# Patient Record
Sex: Female | Born: 1945 | Race: White | Hispanic: No | Smoking: Former smoker
Health system: Southern US, Community
[De-identification: ages and names within clinical notes are randomized; demographics above are authoritative.]

## PROBLEM LIST (undated history)

## (undated) DIAGNOSIS — I480 Paroxysmal atrial fibrillation: Secondary | ICD-10-CM

## (undated) DIAGNOSIS — M1990 Unspecified osteoarthritis, unspecified site: Secondary | ICD-10-CM

## (undated) DIAGNOSIS — Z974 Presence of external hearing-aid: Secondary | ICD-10-CM

## (undated) DIAGNOSIS — I70213 Atherosclerosis of native arteries of extremities with intermittent claudication, bilateral legs: Secondary | ICD-10-CM

## (undated) DIAGNOSIS — I10 Essential (primary) hypertension: Secondary | ICD-10-CM

## (undated) DIAGNOSIS — K635 Polyp of colon: Secondary | ICD-10-CM

## (undated) DIAGNOSIS — J939 Pneumothorax, unspecified: Secondary | ICD-10-CM

## (undated) DIAGNOSIS — K279 Peptic ulcer, site unspecified, unspecified as acute or chronic, without hemorrhage or perforation: Secondary | ICD-10-CM

## (undated) DIAGNOSIS — Z972 Presence of dental prosthetic device (complete) (partial): Secondary | ICD-10-CM

## (undated) DIAGNOSIS — E785 Hyperlipidemia, unspecified: Secondary | ICD-10-CM

## (undated) DIAGNOSIS — E119 Type 2 diabetes mellitus without complications: Secondary | ICD-10-CM

## (undated) DIAGNOSIS — D751 Secondary polycythemia: Secondary | ICD-10-CM

## (undated) DIAGNOSIS — K5520 Angiodysplasia of colon without hemorrhage: Secondary | ICD-10-CM

## (undated) DIAGNOSIS — C189 Malignant neoplasm of colon, unspecified: Secondary | ICD-10-CM

## (undated) DIAGNOSIS — I4891 Unspecified atrial fibrillation: Secondary | ICD-10-CM

## (undated) DIAGNOSIS — C801 Malignant (primary) neoplasm, unspecified: Secondary | ICD-10-CM

## (undated) DIAGNOSIS — D509 Iron deficiency anemia, unspecified: Secondary | ICD-10-CM

## (undated) DIAGNOSIS — D126 Benign neoplasm of colon, unspecified: Secondary | ICD-10-CM

## (undated) DIAGNOSIS — Z8673 Personal history of transient ischemic attack (TIA), and cerebral infarction without residual deficits: Secondary | ICD-10-CM

## (undated) DIAGNOSIS — Z85038 Personal history of other malignant neoplasm of large intestine: Secondary | ICD-10-CM

## (undated) DIAGNOSIS — M7582 Other shoulder lesions, left shoulder: Secondary | ICD-10-CM

## (undated) HISTORY — PX: CHOLECYSTECTOMY: SHX55

## (undated) HISTORY — PX: COLON SURGERY: SHX602

## (undated) HISTORY — PX: EXPLORATORY LAPAROTOMY: SUR591

## (undated) HISTORY — PX: DIAGNOSTIC LAPAROSCOPY: SUR761

---

## 2005-11-01 ENCOUNTER — Ambulatory Visit: Payer: Self-pay | Admitting: Internal Medicine

## 2006-03-23 ENCOUNTER — Ambulatory Visit: Payer: Self-pay | Admitting: Gastroenterology

## 2006-05-21 ENCOUNTER — Inpatient Hospital Stay: Payer: Self-pay | Admitting: Surgery

## 2006-06-02 ENCOUNTER — Inpatient Hospital Stay: Payer: Self-pay | Admitting: Surgery

## 2006-06-12 ENCOUNTER — Ambulatory Visit: Payer: Self-pay | Admitting: Internal Medicine

## 2006-06-28 ENCOUNTER — Ambulatory Visit: Payer: Self-pay | Admitting: Internal Medicine

## 2006-07-23 ENCOUNTER — Ambulatory Visit: Payer: Self-pay | Admitting: Internal Medicine

## 2006-12-13 ENCOUNTER — Ambulatory Visit: Payer: Self-pay | Admitting: Internal Medicine

## 2006-12-17 ENCOUNTER — Ambulatory Visit: Payer: Self-pay | Admitting: Internal Medicine

## 2007-05-31 ENCOUNTER — Ambulatory Visit: Payer: Self-pay | Admitting: Gastroenterology

## 2008-02-10 ENCOUNTER — Ambulatory Visit: Payer: Self-pay | Admitting: Family Medicine

## 2009-10-06 ENCOUNTER — Ambulatory Visit: Payer: Self-pay | Admitting: Internal Medicine

## 2010-07-22 ENCOUNTER — Ambulatory Visit: Payer: Self-pay | Admitting: Gastroenterology

## 2010-07-25 LAB — PATHOLOGY REPORT

## 2010-10-28 ENCOUNTER — Ambulatory Visit: Payer: Self-pay | Admitting: Gastroenterology

## 2010-10-31 LAB — PATHOLOGY REPORT

## 2011-03-22 ENCOUNTER — Ambulatory Visit: Payer: Self-pay | Admitting: Internal Medicine

## 2011-09-21 ENCOUNTER — Emergency Department: Payer: Self-pay | Admitting: *Deleted

## 2015-05-20 DIAGNOSIS — Z85038 Personal history of other malignant neoplasm of large intestine: Secondary | ICD-10-CM | POA: Insufficient documentation

## 2015-07-09 ENCOUNTER — Ambulatory Visit: Payer: Medicare Other | Admitting: Anesthesiology

## 2015-07-09 ENCOUNTER — Encounter: Payer: Self-pay | Admitting: Anesthesiology

## 2015-07-09 ENCOUNTER — Ambulatory Visit
Admission: RE | Admit: 2015-07-09 | Discharge: 2015-07-09 | Disposition: A | Discharge status: Home / Self Care | Payer: Medicare Other | Source: Ambulatory Visit | Attending: Gastroenterology | Admitting: Gastroenterology

## 2015-07-09 ENCOUNTER — Encounter
Admission: RE | Disposition: A | Discharge status: Home / Self Care | Payer: Self-pay | Source: Ambulatory Visit | Attending: Gastroenterology

## 2015-07-09 DIAGNOSIS — Z79899 Other long term (current) drug therapy: Secondary | ICD-10-CM | POA: Diagnosis not present

## 2015-07-09 DIAGNOSIS — Z08 Encounter for follow-up examination after completed treatment for malignant neoplasm: Secondary | ICD-10-CM | POA: Insufficient documentation

## 2015-07-09 DIAGNOSIS — Z85038 Personal history of other malignant neoplasm of large intestine: Secondary | ICD-10-CM | POA: Diagnosis present

## 2015-07-09 DIAGNOSIS — M1990 Unspecified osteoarthritis, unspecified site: Secondary | ICD-10-CM | POA: Insufficient documentation

## 2015-07-09 DIAGNOSIS — D125 Benign neoplasm of sigmoid colon: Secondary | ICD-10-CM | POA: Diagnosis not present

## 2015-07-09 DIAGNOSIS — K621 Rectal polyp: Secondary | ICD-10-CM | POA: Insufficient documentation

## 2015-07-09 DIAGNOSIS — Z8711 Personal history of peptic ulcer disease: Secondary | ICD-10-CM | POA: Diagnosis not present

## 2015-07-09 DIAGNOSIS — F17210 Nicotine dependence, cigarettes, uncomplicated: Secondary | ICD-10-CM | POA: Insufficient documentation

## 2015-07-09 HISTORY — DX: Pneumothorax, unspecified: J93.9

## 2015-07-09 HISTORY — PX: COLONOSCOPY WITH PROPOFOL: SHX5780

## 2015-07-09 HISTORY — DX: Unspecified osteoarthritis, unspecified site: M19.90

## 2015-07-09 HISTORY — DX: Peptic ulcer, site unspecified, unspecified as acute or chronic, without hemorrhage or perforation: K27.9

## 2015-07-09 HISTORY — DX: Malignant (primary) neoplasm, unspecified: C80.1

## 2015-07-09 SURGERY — COLONOSCOPY WITH PROPOFOL
Anesthesia: General

## 2015-07-09 MED ORDER — PROPOFOL INFUSION 10 MG/ML OPTIME
INTRAVENOUS | Status: DC | PRN
  Administered 2015-07-09: 125 ug/kg/min via INTRAVENOUS

## 2015-07-09 MED ORDER — LIDOCAINE HCL (CARDIAC) 20 MG/ML IV SOLN
INTRAVENOUS | Status: DC | PRN
  Administered 2015-07-09: 100 mg via INTRAVENOUS

## 2015-07-09 MED ORDER — PHENYLEPHRINE HCL 10 MG/ML IJ SOLN
INTRAMUSCULAR | Status: DC | PRN
  Administered 2015-07-09 (×2): 100 ug via INTRAVENOUS
  Administered 2015-07-09 (×5): 200 ug via INTRAVENOUS

## 2015-07-09 MED ORDER — SODIUM CHLORIDE 0.9 % IV SOLN
INTRAVENOUS | Status: DC
  Administered 2015-07-09: 1000 mL via INTRAVENOUS

## 2015-07-09 MED ORDER — SODIUM CHLORIDE 0.9 % IV SOLN: INTRAVENOUS | Status: DC

## 2015-07-09 MED ORDER — PROPOFOL 10 MG/ML IV BOLUS
INTRAVENOUS | Status: DC | PRN
  Administered 2015-07-09: 90 mg via INTRAVENOUS

## 2015-07-09 NOTE — Anesthesia Postprocedure Evaluation (Signed)
  Anesthesia Post-op Note  Patient: Dana Mcclure  Procedure(s) Performed: Procedure(s): COLONOSCOPY WITH PROPOFOL (N/A)  Anesthesia type:General  Patient location: PACU  Post pain: Pain level controlled  Post assessment: Post-op Vital signs reviewed, Patient's Cardiovascular Status Stable, Respiratory Function Stable, Patent Airway and No signs of Nausea or vomiting  Post vital signs: Reviewed and stable  Last Vitals:  Filed Vitals:   07/09/15 1140  BP: 146/71  Pulse: 66  Temp:   Resp: 16    Level of consciousness: awake, alert  and patient cooperative  Complications: No apparent anesthesia complications

## 2015-07-09 NOTE — H&P (Signed)
Outpatient short stay form Pre-procedure 07/09/2015 10:28 AM Lollie Sails MD  Primary Physician: Dr. Lisette Grinder  Reason for visit:  Colonoscopy  History of present illness:  Patient is a 69 year old female who is presenting today for a colonoscopy in regards to her personal history of colon cancer and polyps. Her last colonoscopy was in 2012 with an adenoma at that time. Previously she had had a right hemicolectomy in 2007. She does not take aspirin products or anticoagulation medications.        Current facility-administered medications:  .  0.9 %  sodium chloride infusion, , Intravenous, Continuous, Lollie Sails, MD, Last Rate: 20 mL/hr at 07/09/15 1016, 1,000 mL at 07/09/15 1016 .  0.9 %  sodium chloride infusion, , Intravenous, Continuous, Lollie Sails, MD  Prescriptions prior to admission  Medication Sig Dispense Refill Last Dose  . acetaminophen (TYLENOL) 325 MG tablet Take 650 mg by mouth every 4 (four) hours as needed.   Past Month at Unknown time     No Known Allergies   Past Medical History  Diagnosis Date  . Cancer   . Arthritis   . PUD (peptic ulcer disease)   . Pneumothorax on right     Review of systems:      Physical Exam    Heart and lungs: Regular rate and rhythm without rub or gallop lungs are bilaterally clear    HEENT: Norm cephalic atraumatic eyes are anicteric    Other:     Pertinant exam for procedure: Soft nontender nondistended bowel sounds positive normoactive    Planned proceedures: Colonoscopy and indicated procedures I have discussed the risks benefits and complications of procedures to include not limited to bleeding, infection, perforation and the risk of sedation and the patient wishes to proceed.    Lollie Sails, MD Gastroenterology 07/09/2015  10:28 AM

## 2015-07-09 NOTE — Transfer of Care (Signed)
Immediate Anesthesia Transfer of Care Note  Patient: Dana Mcclure  Procedure(s) Performed: Procedure(s): COLONOSCOPY WITH PROPOFOL (N/A)  Patient Location: Endoscopy Unit  Anesthesia Type:General  Level of Consciousness: awake  Airway & Oxygen Therapy: Patient Spontanous Breathing and Patient connected to nasal cannula oxygen  Post-op Assessment: Report given to RN and Post -op Vital signs reviewed and stable  Post vital signs: Reviewed and stable  Last Vitals:  Filed Vitals:   07/09/15 1006  BP: 160/72  Pulse: 72  Temp: 36.3 C  Resp: 18    Complications: No apparent anesthesia complications

## 2015-07-09 NOTE — Anesthesia Preprocedure Evaluation (Addendum)
Anesthesia Evaluation  Patient identified by MRN, date of birth, ID band Patient awake    Reviewed: Allergy & Precautions, H&P , NPO status , Patient's Chart, lab work & pertinent test results  Airway Mallampati: III  TM Distance: >3 FB Neck ROM: limited    Dental  (+) Caps, Poor Dentition, Chipped   Pulmonary neg pulmonary ROS, neg shortness of breath, Current Smoker,    Pulmonary exam normal breath sounds clear to auscultation       Cardiovascular Exercise Tolerance: Good (-) Past MI Normal cardiovascular exam Rhythm:regular Rate:Normal     Neuro/Psych negative neurological ROS  negative psych ROS   GI/Hepatic Neg liver ROS, PUD, neg GERD  ,  Endo/Other  negative endocrine ROS  Renal/GU negative Renal ROS  negative genitourinary   Musculoskeletal  (+) Arthritis ,   Abdominal   Peds  Hematology negative hematology ROS (+)   Anesthesia Other Findings Past Medical History:   Cancer                                                       Arthritis                                                    PUD (peptic ulcer disease)                                   Pneumothorax on right                                        Reproductive/Obstetrics negative OB ROS                            Anesthesia Physical Anesthesia Plan  ASA: III  Anesthesia Plan: General   Post-op Pain Management:    Induction:   Airway Management Planned:   Additional Equipment:   Intra-op Plan:   Post-operative Plan:   Informed Consent: I have reviewed the patients History and Physical, chart, labs and discussed the procedure including the risks, benefits and alternatives for the proposed anesthesia with the patient or authorized representative who has indicated his/her understanding and acceptance.   Dental Advisory Given  Plan Discussed with: Anesthesiologist, CRNA and Surgeon  Anesthesia Plan Comments:          Anesthesia Quick Evaluation

## 2015-07-09 NOTE — Op Note (Signed)
[facility] Gastroenterology Patient Name: Dana Mcclure Procedure Date: 07/09/2015 10:21 AM MRN: [id number] Account #: [id number] Date of Birth: [birth year] Admit Type: Outpatient Age: 69 Room: [facility] ENDO ROOM 2 Gender: Female Note Status: Finalized Procedure:         Colonoscopy Indications:       Personal history of malignant neoplasm of the colon,                     Personal history of colonic polyps Providers:         Lollie Sails, MD Referring MD:      Hewitt Blade. Sarina Ser, MD (Referring MD) Medicines:         Monitored Anesthesia Care Complications:     No immediate complications. Procedure:         Pre-Anesthesia Assessment:                    - ASA Grade Assessment: III - A patient with severe                     systemic disease.                    After obtaining informed consent, the colonoscope was                     passed under direct vision. Throughout the procedure, the                     patient's blood pressure, pulse, and oxygen saturations                     were monitored continuously. The Olympus PCF-H180AL                     colonoscope ( S#: [id number] ) was introduced through the                     anus and advanced to the the ileocolonic anastomosis. The                     colonoscopy was performed without difficulty. The patient                     tolerated the procedure well. The quality of the bowel                     preparation was good. Findings:      There was evidence of a prior functional end-to-end ileo-colonic       anastomosis in the proximal transverse colon. This was patent. This was       characterized by healthy appearing mucosa.      Three sessile polyps were found in the mid sigmoid colon. The polyps       were 1 to 3 mm in size. These polyps were removed with a cold biopsy       forceps. Resection and retrieval were complete.      A 3 mm polyp was found in the distal sigmoid colon. The polyp was   sessile. The polyp was removed with a cold biopsy forceps. Resection and       retrieval were complete.      Three sessile polyps were found in the rectum. The polyps were 1 to 2 mm  in size. These polyps were removed with a cold biopsy forceps. Resection       and retrieval were complete.      The digital rectal exam was normal.      A few small-mouthed diverticula were found in the sigmoid colon.      A tattoo was seen in the descending colon. The tattoo site appeared       normal. Impression:        - Patent functional end-to-end ileo-colonic anastomosis,                     characterized by healthy appearing mucosa.                    - Three 1 to 3 mm polyps in the mid sigmoid colon.                     Resected and retrieved.                    - One 3 mm polyp in the distal sigmoid colon. Resected and                     retrieved.                    - Three 1 to 2 mm polyps in the rectum. Resected and                     retrieved. Recommendation:    - Await pathology results.                    - Telephone GI clinic for pathology results in 1 week. Procedure Code(s): --- Professional ---                    [phone], Colonoscopy, flexible; with biopsy, single or                     multiple Diagnosis Code(s): --- Professional ---                    211.3, Benign neoplasm of colon                    569.0, Anal and rectal polyp                    V10.05, Personal history of malignant neoplasm of large                     intestine                    V12.72, Personal history of colonic polyps                    V45.3, Intestinal bypass or anastomosis status CPT copyright 2014 American Medical Association. All rights reserved. The codes documented in this report are preliminary and upon coder review may  be revised to meet current compliance requirements. Lollie Sails, MD 07/09/2015 11:08:26 AM This report has been signed electronically. Number of Addenda: 0 Note  Initiated On: 07/09/2015 10:21 AM Scope Withdrawal Time: 0 hours 19 minutes 0 seconds  Total Procedure Duration: 0 hours 26 minutes 44 seconds       [facility]

## 2015-07-10 ENCOUNTER — Encounter: Payer: Self-pay | Admitting: Gastroenterology

## 2015-07-12 LAB — SURGICAL PATHOLOGY

## 2015-10-24 HISTORY — PX: COLONOSCOPY: SHX174

## 2016-05-02 DIAGNOSIS — G459 Transient cerebral ischemic attack, unspecified: Secondary | ICD-10-CM

## 2016-05-02 HISTORY — DX: Transient cerebral ischemic attack, unspecified: G45.9

## 2016-08-08 DIAGNOSIS — Z720 Tobacco use: Secondary | ICD-10-CM | POA: Insufficient documentation

## 2016-08-10 ENCOUNTER — Other Ambulatory Visit: Payer: Self-pay | Admitting: Internal Medicine

## 2016-08-10 DIAGNOSIS — Z1231 Encounter for screening mammogram for malignant neoplasm of breast: Secondary | ICD-10-CM

## 2016-08-28 ENCOUNTER — Inpatient Hospital Stay: Payer: Medicare Other

## 2016-08-28 ENCOUNTER — Encounter: Payer: Self-pay | Admitting: Hematology and Oncology

## 2016-08-28 ENCOUNTER — Inpatient Hospital Stay: Payer: Medicare Other | Attending: Hematology and Oncology | Admitting: Hematology and Oncology

## 2016-08-28 ENCOUNTER — Encounter (INDEPENDENT_AMBULATORY_CARE_PROVIDER_SITE_OTHER): Payer: Self-pay

## 2016-08-28 VITALS — BP 165/70 | HR 72 | Resp 18 | Ht 64.0 in | Wt 213.6 lb

## 2016-08-28 DIAGNOSIS — Z7982 Long term (current) use of aspirin: Secondary | ICD-10-CM | POA: Diagnosis not present

## 2016-08-28 DIAGNOSIS — D751 Secondary polycythemia: Secondary | ICD-10-CM

## 2016-08-28 DIAGNOSIS — Z8673 Personal history of transient ischemic attack (TIA), and cerebral infarction without residual deficits: Secondary | ICD-10-CM | POA: Insufficient documentation

## 2016-08-28 DIAGNOSIS — Z79899 Other long term (current) drug therapy: Secondary | ICD-10-CM

## 2016-08-28 DIAGNOSIS — F17210 Nicotine dependence, cigarettes, uncomplicated: Secondary | ICD-10-CM

## 2016-08-28 DIAGNOSIS — Z86010 Personal history of colonic polyps: Secondary | ICD-10-CM | POA: Insufficient documentation

## 2016-08-28 DIAGNOSIS — M129 Arthropathy, unspecified: Secondary | ICD-10-CM | POA: Diagnosis not present

## 2016-08-28 DIAGNOSIS — Z85038 Personal history of other malignant neoplasm of large intestine: Secondary | ICD-10-CM | POA: Diagnosis not present

## 2016-08-28 DIAGNOSIS — Z8711 Personal history of peptic ulcer disease: Secondary | ICD-10-CM | POA: Diagnosis not present

## 2016-08-28 DIAGNOSIS — Z9049 Acquired absence of other specified parts of digestive tract: Secondary | ICD-10-CM | POA: Insufficient documentation

## 2016-08-28 LAB — IRON AND TIBC
Iron: 66 ug/dL (ref 28–170)
Saturation Ratios: 21 % (ref 10.4–31.8)
TIBC: 317 ug/dL (ref 250–450)
UIBC: 251 ug/dL

## 2016-08-28 LAB — CBC WITH DIFFERENTIAL/PLATELET
Basophils Absolute: 0.1 10*3/uL (ref 0–0.1)
Basophils Relative: 1 %
Eosinophils Absolute: 0.1 10*3/uL (ref 0–0.7)
Eosinophils Relative: 2 %
HCT: 42.5 % (ref 35.0–47.0)
Hemoglobin: 14.9 g/dL (ref 12.0–16.0)
Lymphocytes Relative: 24 %
Lymphs Abs: 2.3 10*3/uL (ref 1.0–3.6)
MCH: 31.3 pg (ref 26.0–34.0)
MCHC: 35.1 g/dL (ref 32.0–36.0)
MCV: 89.1 fL (ref 80.0–100.0)
Monocytes Absolute: 0.7 10*3/uL (ref 0.2–0.9)
Monocytes Relative: 7 %
Neutro Abs: 6.4 10*3/uL (ref 1.4–6.5)
Neutrophils Relative %: 66 %
Platelets: 308 10*3/uL (ref 150–440)
RBC: 4.77 MIL/uL (ref 3.80–5.20)
RDW: 13.2 % (ref 11.5–14.5)
WBC: 9.7 10*3/uL (ref 3.6–11.0)

## 2016-08-28 LAB — FERRITIN: Ferritin: 101 ng/mL (ref 11–307)

## 2016-08-28 NOTE — Progress Notes (Signed)
Patient here today as new evaluation regarding erythrocytosis.  Referred by Dr. Gilford Rile.  Patient has history of colon cancer with colon resection in 2007.  Last colonoscopy 2017.  Patient states she started having cold symptoms on Friday with sore throat, cough and congestion in head and chest.  States she took Alka-Seltzer cold medication yesterday.  Has not taken any today.

## 2016-08-28 NOTE — Progress Notes (Signed)
[place] [facility] day:  08/28/2016  Chief Complaint: Dana Mcclure is a 70 y.o. 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 Lisette Grinder for assessment and management.  HPI:   The patient has a 40 pack year smoking history.  She stopped smoking 2 weeks ago.  She denies any known history of sleep apnea.  She states that she does not feel well rested and can fall asleep during the day.  She sometimes feels better outside.  She smokes in her house.  She denies any family history of any blood disorders.  She has a history of a TIA in 02/2016.  She is on an aspirin.  She had a CXR on 08/08/2016.  No results are available.  CBC on 03/18/2015 revealed a hematocrit of 46, hemoglobin 15.8, MCV 90.0, platelets 338,000, WBC 12,300 with an [place] of 6930.  CBC on 05/02/2016 included a hematocrit of 47.3, hemoglobin 13.9, MCV 90.3, platelets 391,000, and white count 11,500.   CBC on 08/01/2016 included a hematocrit of 47.4, hemoglobin 16.1, MCV 90.8, platelets 395,000, white count 12,500 with an [place] of 7540.  Differential included percent segs, 29% lymphs, 7% monocytes, 2% eosinophils and 1% basophils. Basophil count has ranged between 120-140 (0-90) over the past year.  Comprehensive metabolic panel on [id number] was normal with a creatinine of 0.8 and normal liver function tests.  She has a history of colon cancer in 2007.  She states that she had guaiac + cards.  She underwent surgery at [facility] (no records available).  No lymph nodes were involved.  She was offered the option of chemotherapy.  She did not receive chemotherapy.    Colonoscopy on 07/09/2015 by Dr. Loistine Simas revealed an end-to-end ileocolonic anastomosis with a healthy-appearing mucosa. There were three 1-3 mm polyps in the mid sigmoid colon, one 3 mm polyp in the distal sigmoid colon, and three 1-2 mm polyps in the rectum.  Pathology revealed hyperplastic polyps (6) and a tubular  adenoma.  There was no dysplasia or malignancy.  Symptomatically, she denies any complaints.  She has had some sinus congestion x 2 days.   Past Medical History:  Diagnosis Date  . Arthritis   . Cancer ([place])   . Pneumothorax on right   . PUD (peptic ulcer disease)     Past Surgical History:  Procedure Laterality Date  . CHOLECYSTECTOMY    . COLON SURGERY    . COLONOSCOPY  2017  . COLONOSCOPY WITH PROPOFOL N/A 07/09/2015   Procedure: COLONOSCOPY WITH PROPOFOL;  Surgeon: Lollie Sails, MD;  Location: [facility] ENDOSCOPY;  Service: Endoscopy;  Laterality: N/A;    History reviewed. No pertinent family history.  Social History:  reports that she has been smoking Cigarettes.  She has been smoking about 0.50 packs per day. She does not have any smokeless tobacco history on file. Her alcohol and drug histories are not on file.  She stopped smoking 2 weeks ago.  She denies any exposure to radiation or toxins.  She is a retired Doctor, hospital at [employer].  The patient is accompanied by her daughter, Abigail Butts, today.  Allergies: No Known Allergies  Current Medications: Current Outpatient Prescriptions  Medication Sig Dispense Refill  . acetaminophen (TYLENOL) 325 MG tablet Take 650 mg by mouth every 4 (four) hours as needed.    Marland Kitchen aspirin EC 81 MG tablet 81 mg once daily.     No current facility-administered medications for this visit.  Review of Systems:  GENERAL:  Feels good.  Active.  No fevers.  Some sweats at night (non-drenching).  Weight gain (retaining fluid). PERFORMANCE STATUS (ECOG):  0 HEENT:  Sinus congestion x 2 days.  No visual changes, sore throat, mouth sores or tenderness. Lungs: No shortness of breath or cough.  No hemoptysis. Cardiac:  No chest pain, palpitations, orthopnea, or PND. GI:  No nausea, vomiting, diarrhea, constipation, melena or hematochezia.  Colonoscopy 07/09/2015. GU:  No urgency, frequency, dysuria, or hematuria. Musculoskeletal:  No back pain.   No joint pain.  No muscle tenderness. Extremities:  No pain or swelling.  Uses compression stockings when feet and legs swell. Skin:  No rashes or skin changes. Neuro:  No headache, numbness or weakness, balance or coordination issues.  History of TIA. Endocrine:  No diabetes, thyroid issues, hot flashes or night sweats. Psych:  No mood changes, depression or anxiety. Pain:  No focal pain. Review of systems:  All other systems reviewed and found to be negative.  Physical Exam: Blood pressure (!) 165/70, pulse 72, resp. rate 18, height 5\' 4"  (1.626 m), weight 213 lb 10 oz (96.9 kg). GENERAL:  Well developed, well nourished, woman sitting comfortably in the exam room in no acute distress. MENTAL STATUS:  Alert and oriented to person, place and time. HEAD:  Short styled red hair with slight graying.  Normocephalic, atraumatic, face symmetric, no Cushingoid features. EYES:  Glasses.  Brown eyes.  Pupils equal round and reactive to light and accomodation.  No conjunctivitis or scleral icterus. ENT:  Oropharynx clear without lesion.  Tongue normal. Mucous membranes moist.  RESPIRATORY:  Clear to auscultation without rales, wheezes or rhonchi. CARDIOVASCULAR:  Regular rate and rhythm without murmur, rub or gallop. ABDOMEN:  Soft, non-tender, with active bowel sounds, and no appreciable hepatosplenomegaly.  No masses. SKIN:  No rashes, ulcers or lesions. EXTREMITIES: No edema, no skin discoloration or tenderness.  No palpable cords. LYMPH NODES: No palpable cervical, supraclavicular, axillary or inguinal adenopathy  NEUROLOGICAL: Unremarkable. PSYCH:  Appropriate.   Appointment on 08/28/2016  Component Date Value Ref Range Status  . WBC 08/28/2016 9.7  3.6 - 11.0 K/uL Final  . RBC 08/28/2016 4.77  3.80 - 5.20 MIL/uL Final  . Hemoglobin 08/28/2016 14.9  12.0 - 16.0 g/dL Final  . HCT 08/28/2016 42.5  35.0 - 47.0 % Final  . MCV 08/28/2016 89.1  80.0 - 100.0 fL Final  . MCH 08/28/2016 31.3   26.0 - 34.0 pg Final  . MCHC 08/28/2016 35.1  32.0 - 36.0 g/dL Final  . RDW 08/28/2016 13.2  11.5 - 14.5 % Final  . Platelets 08/28/2016 308  150 - 440 K/uL Final  . Neutrophils Relative % 08/28/2016 66  % Final  . Neutro Abs 08/28/2016 6.4  1.4 - 6.5 K/uL Final  . Lymphocytes Relative 08/28/2016 24  % Final  . Lymphs Abs 08/28/2016 2.3  1.0 - 3.6 K/uL Final  . Monocytes Relative 08/28/2016 7  % Final  . Monocytes Absolute 08/28/2016 0.7  0.2 - 0.9 K/uL Final  . Eosinophils Relative 08/28/2016 2  % Final  . Eosinophils Absolute 08/28/2016 0.1  0 - 0.7 K/uL Final  . Basophils Relative 08/28/2016 1  % Final  . Basophils Absolute 08/28/2016 0.1  0 - 0.1 K/uL Final  . Carbon Monoxide, Blood 08/29/2016 2.5  0.0 - 3.6 % Final   Comment: (NOTE)  Environmental Exposure:                             Nonsmokers           <3.7                             Smokers              <9.9                            Occupational Exposure:                             BEI                   3.5                                Detection Limit =  0.2 Performed At: [facility] [place], [place] [id number] Lindon Romp MD [id number]   . Ferritin 08/28/2016 101  11 - 307 ng/mL Final  . Iron 08/28/2016 66  28 - 170 ug/dL Final  . TIBC 08/28/2016 317  250 - 450 ug/dL Final  . Saturation Ratios 08/28/2016 21  10.4 - 31.8 % Final  . UIBC 08/28/2016 251  ug/dL Final  . JAK2 GenotypR 09/08/2016 QUANTITY NOT SUFFICIENT TO REPEAT TEST   Final   Comment: (NOTE) Specimen quantity insufficient for verification by repeat analysis. Notified Demetria Singletary 09/08/16   . BACKGROUND: 09/08/2016 Comment   Final   Comment: (NOTE) JAK2 is a cytoplasmic tyrosine kinase with a key role in signal transduction from multiple hematopoietic growth factor receptors. A point mutation within exon 14 of the JAK2 gene [id number]) encoding a valine to phenylalanine substitution at  position 617 of the JAK2 protein (V617F) has been identified in most patients with polycythemia vera, and in about half of those with either essential thrombocythemia or idiopathic myelofibrosis. The V617F has also been detected, although infrequently, in other myeloid disorders such as chronic myelomonocytic leukemia and chronic neutrophilic luekemia. V617F is an acquired mutation that alters a highly conserved valine present in the negative regulatory JH2 domain of the JAK2 protein and is predicted to dysregulate kinase activity. Methodology: Total genomic DNA was extracted and subjected to TaqMan real-time PCR amplification/detection. Two amplification products per sample were monitored by real-time PCR using primers/probes s                          pecific to JAK2 wild type (WT) and JAK2 mutant V617F. The ABI7900 Absolute Quantitation software will compare the patient specimen valuse to the standard curves and generate percent values for wild type and mutant type. In vitro studies have indicated that this assay has an analytical sensitivity of 1%. References: Baxter EJ, Scott Phineas Real, et al. Acquired mutation of the tyrosine kinase JAK2 in human myeloproliferative disorders. Lancet. 2005 Mar 19-25; 365(9464):1054-1061. Alfonso Ramus Couedic JP. A unique clonal JAK2 mutation leading to constitutive signaling causes polycythaemia vera. Nature. 2005 Apr 28; 434(7037):1144-1148. Kralovics R, Passamonti F, Buser AS, et al. A gain-of-function mutation of JAK2 in myeloproliferative disorders. N Engl J Med. 2005 Apr 28;  352(17):1779-1790.   Marland Kitchen Extraction 09/08/2016 Completed   Corrected   Comment: (NOTE) Performed At: [facility] [address] [place], [place] [id number] Nechama Guard MD [id number] Performed At: [facility] [place], [place] [id number] Nechama Guard MD [id number]   . Specimen Type 09/01/2016 BLOOD   Final   . Cells Counted 09/01/2016 200   Final  . Cells Analyzed 09/01/2016 200   Final  . FISH Result 09/01/2016 Comment:   Final  . Interpretation 09/01/2016 Comment:   Final   Comment: (NOTE) .nuc ish 9q34(ASS1,ABL1)x2,22q11.2(BCRx2)[200].      The fluorescence in situ hybridization (FISH) study was normal. FISH, using unique sequence DNA probes for the ABL1 and BCR gene regions showed two ABL1 signals (red), two control ASS1  gene signals (aqua) located adjacent to the ABL1 locus at 9q34, and two BCR signals (green) at 22q11.2 in all interphase nuclei examined. There was NO evidence of CML or ALL-associated BCR/ABL1 dual fusion signals in this analysis.  .      This analysis is limited to abnormalities detectable by the specific probes included in the study. FISH results should be interpreted within the context of a full cytogenetic analysis and pathology evaluation.  Marland Kitchen  .This test was developed and its performance characteristics determined by [employer]). It has not been cleared or approved by the U.S. Food and Drug Administration. The DNA probe vendor for this study was [employer], research (physical sciences)).   . Director Review: 09/01/2016 Comment:   Final   Comment: (NOTE) Wylie Hail, PhD, [facility] Performed At: [facility] [address] [place], [place] [id number] Nechama Guard MD [id number]   . Erythropoietin 08/29/2016 10.2  2.6 - 18.5 mIU/mL Final   Comment: (NOTE) Siemens Immulite 2000 Immunochemiluminometric assay [facility]) Performed At: [facility] [place], [place] [id number] Lindon Romp MD [id number]     Assessment:  Dana Mcclure is a 70 y.o. 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  She has a 40 pack year smoking history.  She stopped smoking 2 weeks ago.  She denies any known history of sleep apnea.  She denies any family history of any blood disorders.  She  has a history of a TIA in 02/2016.  She is on an aspirin.  She had a CXR on 08/08/2016.  No results are available.  CBC on 08/01/2016 included a hematocrit of 47.4, hemoglobin 16.1, MCV 90.8, platelets 395,000, white count 12,500 with an [place] of 7540.  Differential included percent segs, 29% lymphs, 7% monocytes, 2% eosinophils and 1% basophils. Basophil count has ranged between 120-140 (0-90) over the past year.  She has a history of colon cancer in 2007.  She underwent surgery at [facility] (no records available).  No lymph nodes were involved.  She did not receive chemotherapy.    Colonoscopy on 07/09/2015 revealed an end-to-end ileocolonic anastomosis. There were three 1-3 mm polyps in the mid sigmoid colon, one 3 mm polyp in the distal sigmoid colon, and three 1-2 mm polyps in the rectum.  Pathology revealed hyperplastic polyps (6) and a tubular adenoma.  There was no dysplasia or malignancy.  Symptomatically, she denies any complaints.  She has had some sinus congestion x 2 days.  Plan: 1.  Discuss erythrocytosis likely secondary to smoking.  Discuss possible sleep apnea.  Discuss mild basophilia.  Discuss laboratory work-up. 2.  Labs today:  CBC with diff, carbon monoxide level,  JAK2 with reflex, BCR-ABL, epo level, ferritin, iron studies. 3.  RTC in 2 weeks for MD review of work-up.   Lequita Asal, MD  08/28/2016

## 2016-08-29 LAB — ERYTHROPOIETIN: Erythropoietin: 10.2 m[IU]/mL (ref 2.6–18.5)

## 2016-08-29 LAB — CARBON MONOXIDE, BLOOD (PERFORMED AT REF LAB): Carbon Monoxide, Blood: 2.5 % (ref 0.0–3.6)

## 2016-09-01 LAB — BCR-ABL1 FISH
Cells Analyzed: 200
Cells Counted: 200

## 2016-09-11 ENCOUNTER — Inpatient Hospital Stay (HOSPITAL_BASED_OUTPATIENT_CLINIC_OR_DEPARTMENT_OTHER): Payer: Medicare Other | Admitting: Hematology and Oncology

## 2016-09-11 ENCOUNTER — Encounter: Payer: Self-pay | Admitting: Hematology and Oncology

## 2016-09-11 VITALS — BP 162/65 | HR 71 | Resp 18 | Wt 212.3 lb

## 2016-09-11 DIAGNOSIS — Z86010 Personal history of colonic polyps: Secondary | ICD-10-CM

## 2016-09-11 DIAGNOSIS — M129 Arthropathy, unspecified: Secondary | ICD-10-CM | POA: Diagnosis not present

## 2016-09-11 DIAGNOSIS — F17210 Nicotine dependence, cigarettes, uncomplicated: Secondary | ICD-10-CM | POA: Diagnosis not present

## 2016-09-11 DIAGNOSIS — D751 Secondary polycythemia: Secondary | ICD-10-CM | POA: Diagnosis not present

## 2016-09-11 DIAGNOSIS — Z8673 Personal history of transient ischemic attack (TIA), and cerebral infarction without residual deficits: Secondary | ICD-10-CM | POA: Diagnosis not present

## 2016-09-11 DIAGNOSIS — Z85038 Personal history of other malignant neoplasm of large intestine: Secondary | ICD-10-CM

## 2016-09-11 DIAGNOSIS — Z7982 Long term (current) use of aspirin: Secondary | ICD-10-CM

## 2016-09-11 DIAGNOSIS — Z8711 Personal history of peptic ulcer disease: Secondary | ICD-10-CM

## 2016-09-11 DIAGNOSIS — Z9049 Acquired absence of other specified parts of digestive tract: Secondary | ICD-10-CM

## 2016-09-11 DIAGNOSIS — Z79899 Other long term (current) drug therapy: Secondary | ICD-10-CM

## 2016-09-11 NOTE — Progress Notes (Signed)
Paient here today for follow up on Erythrocytosis.

## 2016-09-11 NOTE — Progress Notes (Signed)
[facility] day:  09/11/2016   Chief Complaint: Dana Mcclure is a 70 y.o. female with erythrocytosis who is seen for review of work-up and discussion regarding direction of therapy.  HPI:   The patient was last seen in the hematology clinic on 08/28/2016 for initial consultation.  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  She had a 40 pack year smoking history.  She stopped smoking 2 weeks prior.  She had a history of TIA and was on aspirin.  She underwent a work-up.  CBC revealed a hematocrit of 42.5, hemoglobin 14.9, MCV 89.1, platelets 308,000, white count 9700 with an ANC of 6400. Absolute basophil count was 100 (normal).  Carbon monoxide level was 2.5% (normal).  Ferritin was 101.  Iron studies included a saturation of 21% and a TIBC of 317.  Erythropoietin level was 10.2 (normal). BCR -ABL was negative.  JAK2 testing was not done (quantity insufficient).  Symptomatically, she denies any complaints.   Past Medical History:  Diagnosis Date  . Arthritis   . Cancer (Dana Mcclure)   . Pneumothorax on right   . PUD (peptic ulcer disease)     Past Surgical History:  Procedure Laterality Date  . CHOLECYSTECTOMY    . COLON SURGERY    . COLONOSCOPY  2017  . COLONOSCOPY WITH PROPOFOL N/A 07/09/2015   Procedure: COLONOSCOPY WITH PROPOFOL;  Surgeon: Lollie Sails, MD;  Location: [facility] ENDOSCOPY;  Service: Endoscopy;  Laterality: N/A;    No family history on file.  Social History:  reports that she has been smoking Cigarettes.  She has been smoking about 0.50 packs per day. She does not have any smokeless tobacco history on file. Her alcohol and drug histories are not on file.  She stopped smoking 4 weeks ago.  She denies any exposure to radiation or toxins.  She is a retired Doctor, hospital at [employer].  She lives in [place].  Allergies: No Known Allergies  Current Medications: Current Outpatient Prescriptions   Medication Sig Dispense Refill  . acetaminophen (TYLENOL) 325 MG tablet Take 650 mg by mouth every 4 (four) hours as needed.    Marland Kitchen aspirin EC 81 MG tablet 81 mg once daily.     No current facility-administered medications for this visit.     Review of Systems:  GENERAL:  Feels good.  No fevers.  Some sweats at night (non-drenching).  Weight down 1 pound. PERFORMANCE STATUS (ECOG):  0 HEENT:  No visual changes, sore throat, mouth sores or tenderness. Lungs: No shortness of breath or cough.  No hemoptysis. Cardiac:  No chest pain, palpitations, orthopnea, or PND. GI:  No nausea, vomiting, diarrhea, constipation, melena or hematochezia.  Colonoscopy 07/09/2015. GU:  No urgency, frequency, dysuria, or hematuria. Musculoskeletal:  No back pain.  No joint pain.  No muscle tenderness. Extremities:  No pain or swelling.  Uses compression stockings when feet and legs swell. Skin:  No rashes or skin changes. Neuro:  No headache, numbness or weakness, balance or coordination issues.  History of TIA. Endocrine:  No diabetes, thyroid issues, hot flashes or night sweats. Psych:  No mood changes, depression or anxiety. Pain:  No focal pain. Review of systems:  All other systems reviewed and found to be negative.  Physical Exam: Blood pressure (!) 187/65, pulse 71, resp. rate 18, weight 212 lb 4.9 oz (96.3 kg). GENERAL:  Well developed, well nourished, woman sitting comfortably in the exam room in no  acute distress. MENTAL STATUS:  Alert and oriented to person, place and time. HEAD:  Short styled red hair with slight graying.  Normocephalic, atraumatic, face symmetric, no Cushingoid features. EYES:  Glasses.  Brown eyes.  No conjunctivitis or scleral icterus. NEUROLOGICAL: Unremarkable. PSYCH:  Appropriate.   No visits with results within 3 Day(s) from this visit.  Latest known visit with results is:  Appointment on 08/28/2016  Component Date Value Ref Range Status  . WBC 08/28/2016 9.7  3.6 -  11.0 K/uL Final  . RBC 08/28/2016 4.77  3.80 - 5.20 MIL/uL Final  . Hemoglobin 08/28/2016 14.9  12.0 - 16.0 g/dL Final  . HCT 08/28/2016 42.5  35.0 - 47.0 % Final  . MCV 08/28/2016 89.1  80.0 - 100.0 fL Final  . MCH 08/28/2016 31.3  26.0 - 34.0 pg Final  . MCHC 08/28/2016 35.1  32.0 - 36.0 g/dL Final  . RDW 08/28/2016 13.2  11.5 - 14.5 % Final  . Platelets 08/28/2016 308  150 - 440 K/uL Final  . Neutrophils Relative % 08/28/2016 66  % Final  . Neutro Abs 08/28/2016 6.4  1.4 - 6.5 K/uL Final  . Lymphocytes Relative 08/28/2016 24  % Final  . Lymphs Abs 08/28/2016 2.3  1.0 - 3.6 K/uL Final  . Monocytes Relative 08/28/2016 7  % Final  . Monocytes Absolute 08/28/2016 0.7  0.2 - 0.9 K/uL Final  . Eosinophils Relative 08/28/2016 2  % Final  . Eosinophils Absolute 08/28/2016 0.1  0 - 0.7 K/uL Final  . Basophils Relative 08/28/2016 1  % Final  . Basophils Absolute 08/28/2016 0.1  0 - 0.1 K/uL Final  . Carbon Monoxide, Blood 08/29/2016 2.5  0.0 - 3.6 % Final   Comment: (NOTE)                            Environmental Exposure:                             Nonsmokers           <3.7                             Smokers              <9.9                            Occupational Exposure:                             BEI                   3.5                                Detection Limit =  0.2 Performed At: [facility] [place], [place] [id number] Lindon Romp MD [id number]   . Ferritin 08/28/2016 101  11 - 307 ng/mL Final  . Iron 08/28/2016 66  28 - 170 ug/dL Final  . TIBC 08/28/2016 317  250 - 450 ug/dL Final  . Saturation Ratios 08/28/2016 21  10.4 - 31.8 % Final  . UIBC 08/28/2016 251  ug/dL Final  . JAK2 GenotypR 09/08/2016  QUANTITY NOT SUFFICIENT TO REPEAT TEST   Final   Comment: (NOTE) Specimen quantity insufficient for verification by repeat analysis. Notified Dana Mcclure 09/08/16   . BACKGROUND: 09/08/2016 Comment   Final   Comment: (NOTE) JAK2  is a cytoplasmic tyrosine kinase with a key role in signal transduction from multiple hematopoietic growth factor receptors. A point mutation within exon 14 of the JAK2 gene [id number]) encoding a valine to phenylalanine substitution at position 617 of the JAK2 protein (V617F) has been identified in most patients with polycythemia vera, and in about half of those with either essential thrombocythemia or idiopathic myelofibrosis. The V617F has also been detected, although infrequently, in other myeloid disorders such as chronic myelomonocytic leukemia and chronic neutrophilic luekemia. V617F is an acquired mutation that alters a highly conserved valine present in the negative regulatory JH2 domain of the JAK2 protein and is predicted to dysregulate kinase activity. Methodology: Total genomic DNA was extracted and subjected to TaqMan real-time PCR amplification/detection. Two amplification products per sample were monitored by real-time PCR using primers/probes s                          pecific to JAK2 wild type (WT) and JAK2 mutant V617F. The ABI7900 Absolute Quantitation software will compare the patient specimen valuse to the standard curves and generate percent values for wild type and mutant type. In vitro studies have indicated that this assay has an analytical sensitivity of 1%. References: Baxter EJ, Scott Phineas Real, et al. Acquired mutation of the tyrosine kinase JAK2 in human myeloproliferative disorders. Lancet. 2005 Mar 19-25; 365(9464):1054-1061. Alfonso Ramus Couedic JP. A unique clonal JAK2 mutation leading to constitutive signaling causes polycythaemia vera. Nature. 2005 Apr 28; 434(7037):1144-1148. Kralovics R, Passamonti F, Buser AS, et al. A gain-of-function mutation of JAK2 in myeloproliferative disorders. N Engl J Med. 2005 Apr 28; 352(17):1779-1790.   Marland Kitchen Extraction 09/08/2016 Completed   Corrected   Comment: (NOTE) Performed At: [facility] [address] [place], [place] [id number] Nechama Guard MD [id number] Performed At: [facility] [place], [place] [id number] Nechama Guard MD [id number]   . Specimen Type 09/01/2016 BLOOD   Final  . Cells Counted 09/01/2016 200   Final  . Cells Analyzed 09/01/2016 200   Final  . FISH Result 09/01/2016 Comment:   Final  . Interpretation 09/01/2016 Comment:   Final   Comment: (NOTE) .nuc ish 9q34(ASS1,ABL1)x2,22q11.2(BCRx2)[200].      The fluorescence in situ hybridization (FISH) study was normal. FISH, using unique sequence DNA probes for the ABL1 and BCR gene regions showed two ABL1 signals (red), two control ASS1  gene signals (aqua) located adjacent to the ABL1 locus at 9q34, and two BCR signals (green) at 22q11.2 in all interphase nuclei examined. There was NO evidence of CML or ALL-associated BCR/ABL1 dual fusion signals in this analysis.  .      This analysis is limited to abnormalities detectable by the specific probes included in the study. FISH results should be interpreted within the context of a full cytogenetic analysis and pathology evaluation.  Marland Kitchen  .This test was developed and its performance characteristics determined by [employer]). It has not been cleared or approved by the U.S. Food and Drug Administration. The DNA probe vendor for this study was [employer], research (physical sciences)).   . Director Review: 09/01/2016 Comment:   Final   Comment: (NOTE) Wylie Hail,  PhD, [facility] Performed At: [facility] [address] [place], [place] [id number] Nechama Guard MD [id number]   . Erythropoietin 08/29/2016 10.2  2.6 - 18.5 mIU/mL Final   Comment: (NOTE) Siemens Immulite 2000 Immunochemiluminometric assay [facility] Performed At: [facility] [place], [place] [id number] Lindon Romp MD [id number]     Assessment:  CARTNEY SCHIRMER is a 70 y.o. 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  She has a 40 pack year smoking history.  She stopped smoking 2 weeks ago.  She denies any known history of sleep apnea.  She denies any family history of any blood disorders.  She has a history of a TIA in 02/2016.  She is on an aspirin.  She had a CXR on 08/08/2016.  No results are available.  Work-up on 08/28/2016 revealed a hematocrit of 42.5, hemoglobin 14.9, MCV 89.1, platelets 308,000, white count 9700 with an ANC of 6400.  Absolute basophil count was 100 (normal).  Normal results included:  carbon monoxide level, ferritin, iron studies, erythropoietin level, and BCR -ABL.  JAK2 testing was not done (quantity insufficient).  Basophil count had ranged between 120-140 (0-90) over the past year.  She has a history of colon cancer in 2007.  She underwent surgery at [facility] (no records available).  No lymph nodes were involved.  She did not receive chemotherapy.    Colonoscopy on 07/09/2015 revealed an end-to-end ileocolonic anastomosis. There were three 1-3 mm polyps in the mid sigmoid colon, one 3 mm polyp in the distal sigmoid colon, and three 1-2 mm polyps in the rectum.  Pathology revealed hyperplastic polyps (6) and a tubular adenoma.  There was no dysplasia or malignancy.  Symptomatically, she denies any complaints.  She stopped smoking 4 weeks ago.  Plan: 1.  Review work-up.  Discuss normal hematocrit after discontinuation of smoking.  Previously hematocrit was 47.4 with a hemoglobin of 16.1. 2.  Discuss insufficient quantity for JAK2 testing.  No plan to repeat at present time secondary to improvement in hematocrit. 3.  Encourage patient to remain off cigarettes.  4.  RTC prn.   Lequita Asal, MD  09/11/2016, 2:24 PM

## 2016-09-12 ENCOUNTER — Ambulatory Visit
Admission: RE | Admit: 2016-09-12 | Discharge: 2016-09-12 | Disposition: A | Discharge status: Home / Self Care | Payer: Medicare Other | Source: Ambulatory Visit | Attending: Internal Medicine | Admitting: Internal Medicine

## 2016-09-12 DIAGNOSIS — Z1231 Encounter for screening mammogram for malignant neoplasm of breast: Secondary | ICD-10-CM | POA: Diagnosis not present

## 2016-09-19 LAB — JAK2  V617F QUAL. WITH REFLEX TO EXON 12

## 2016-09-19 LAB — JAK2 EXONS 12-15

## 2016-10-18 ENCOUNTER — Encounter: Payer: Self-pay | Admitting: Hematology and Oncology

## 2017-08-16 IMAGING — MG MM DIGITAL SCREENING BILAT W/ CAD
5 series · 5 of 5 positions shown · non-contrast
Comparison: Previous exam(s).

CLINICAL DATA: Screening.

EXAM:
DIGITAL SCREENING BILATERAL MAMMOGRAM WITH CAD

[L MLO (1 of 2)]
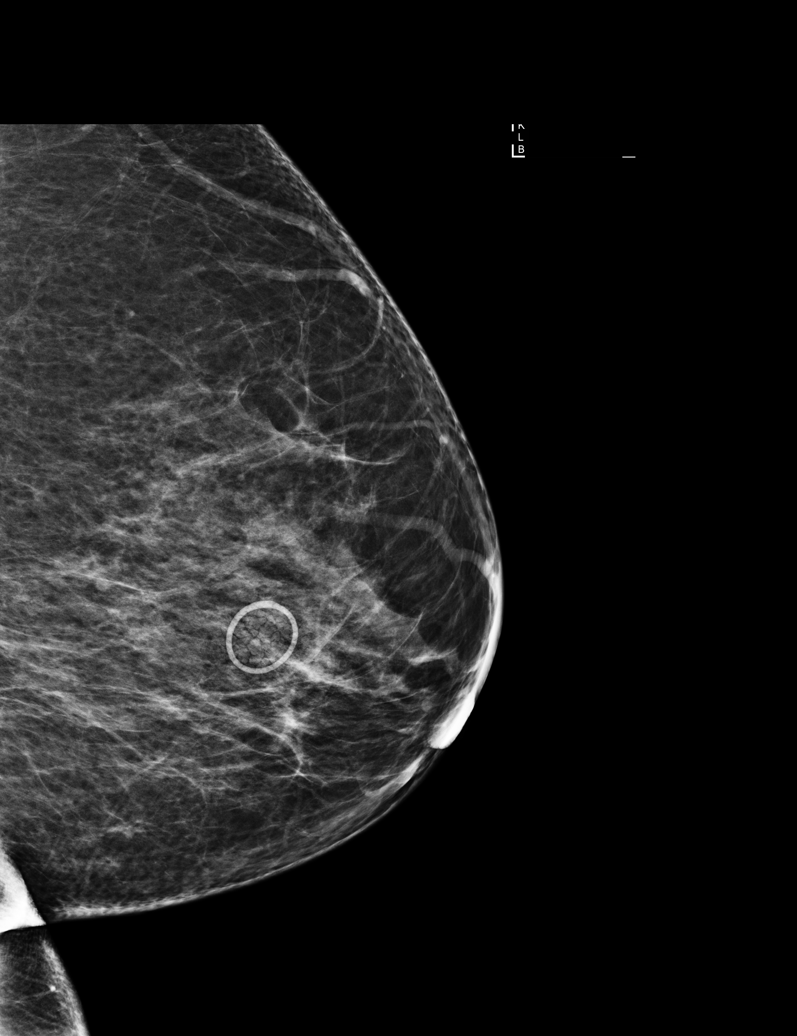

[R MLO]
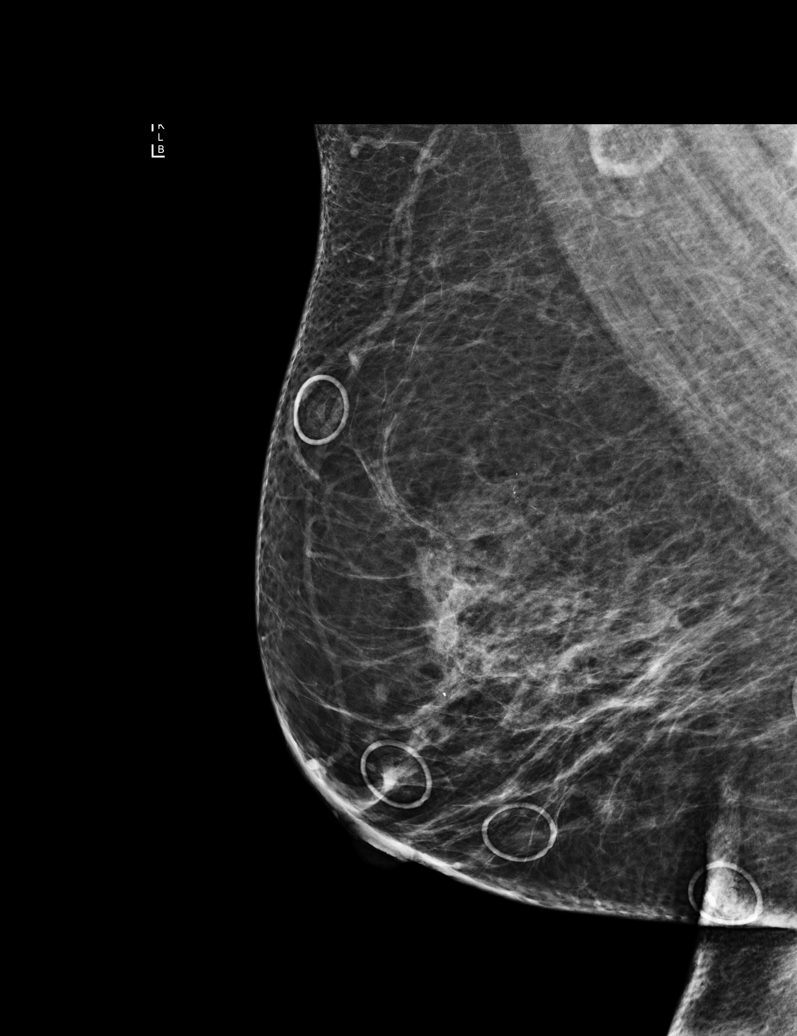

[L CC]
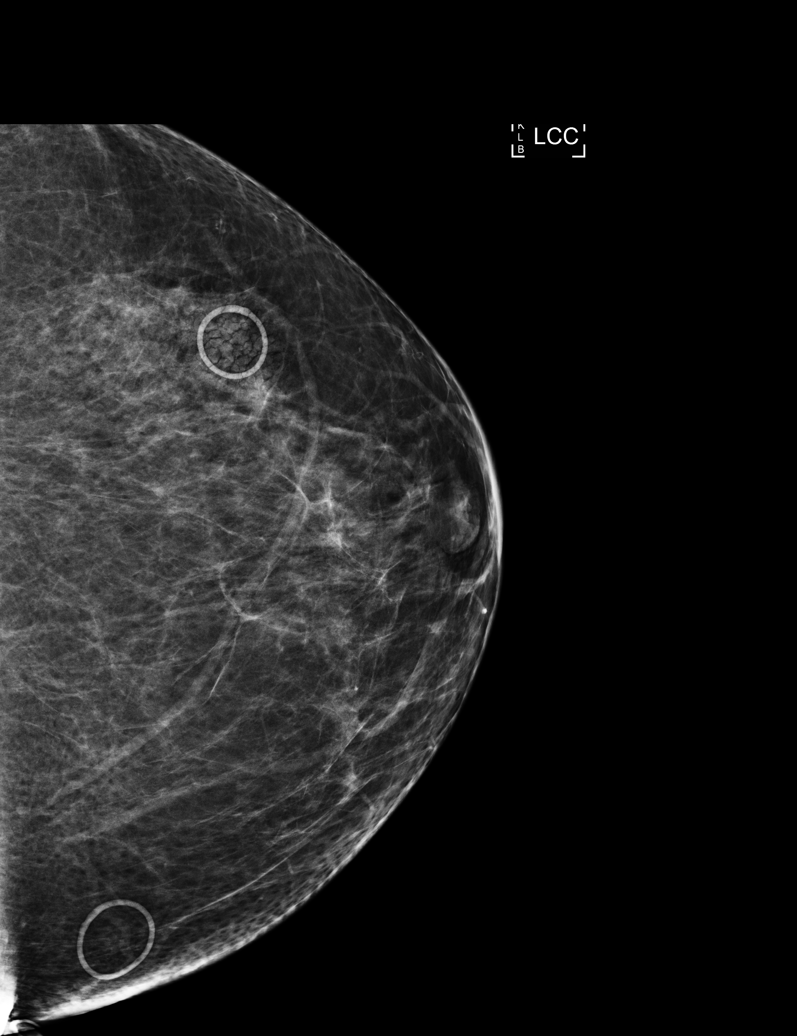

[R CC]
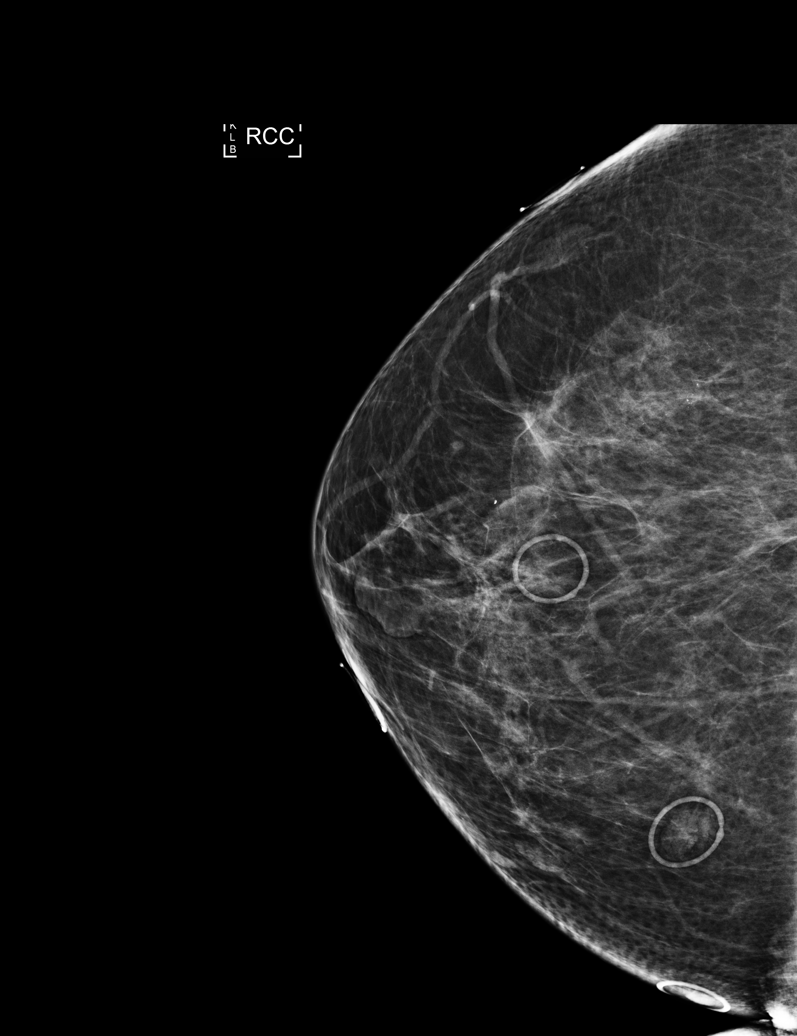

[L MLO (2 of 2)]
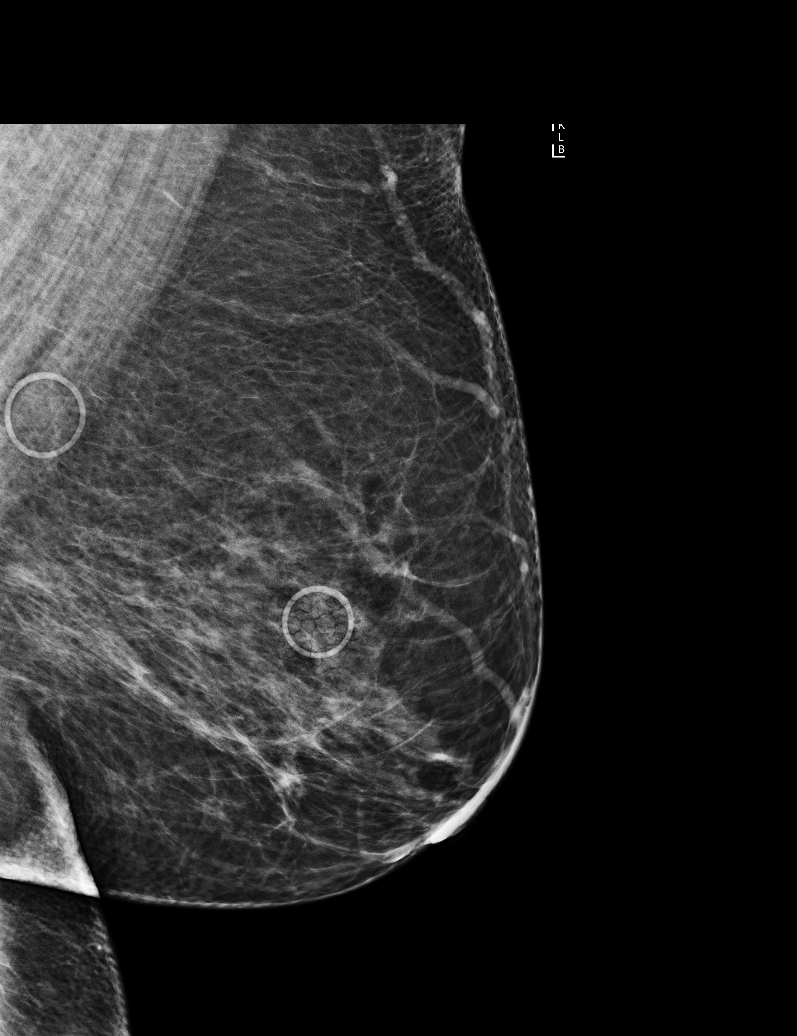

[5 of 5 positions shown; findings below may reference images not displayed]

ACR Breast Density Category b: There are scattered areas of
fibroglandular density.
FINDINGS: There are no findings suspicious for malignancy. Images were
processed with CAD.
IMPRESSION: No mammographic evidence of malignancy. A result letter of this
screening mammogram will be mailed directly to the patient.

RECOMMENDATION:
Screening mammogram in one year. (Code:AS-G-LCT)

BI-RADS CATEGORY  1: Negative.

## 2018-08-07 DIAGNOSIS — I10 Essential (primary) hypertension: Secondary | ICD-10-CM | POA: Insufficient documentation

## 2018-08-07 DIAGNOSIS — Z8673 Personal history of transient ischemic attack (TIA), and cerebral infarction without residual deficits: Secondary | ICD-10-CM | POA: Insufficient documentation

## 2018-08-07 DIAGNOSIS — E785 Hyperlipidemia, unspecified: Secondary | ICD-10-CM | POA: Insufficient documentation

## 2019-04-14 DIAGNOSIS — M7582 Other shoulder lesions, left shoulder: Secondary | ICD-10-CM | POA: Insufficient documentation

## 2019-09-24 ENCOUNTER — Other Ambulatory Visit: Payer: Self-pay | Admitting: Internal Medicine

## 2019-09-24 DIAGNOSIS — Z1231 Encounter for screening mammogram for malignant neoplasm of breast: Secondary | ICD-10-CM

## 2019-12-26 ENCOUNTER — Other Ambulatory Visit: Payer: Self-pay

## 2019-12-26 ENCOUNTER — Ambulatory Visit
Admission: RE | Admit: 2019-12-26 | Discharge: 2019-12-26 | Disposition: A | Discharge status: Home / Self Care | Payer: Medicare Other | Source: Ambulatory Visit | Attending: Internal Medicine | Admitting: Internal Medicine

## 2019-12-26 DIAGNOSIS — Z1231 Encounter for screening mammogram for malignant neoplasm of breast: Secondary | ICD-10-CM | POA: Insufficient documentation

## 2020-04-27 ENCOUNTER — Other Ambulatory Visit
Admission: RE | Admit: 2020-04-27 | Discharge: 2020-04-27 | Disposition: A | Discharge status: Home / Self Care | Payer: Medicare Other | Source: Ambulatory Visit | Attending: Internal Medicine | Admitting: Internal Medicine

## 2020-04-27 ENCOUNTER — Other Ambulatory Visit: Payer: Self-pay

## 2020-04-27 DIAGNOSIS — Z20822 Contact with and (suspected) exposure to covid-19: Secondary | ICD-10-CM | POA: Insufficient documentation

## 2020-04-27 DIAGNOSIS — Z01812 Encounter for preprocedural laboratory examination: Secondary | ICD-10-CM | POA: Diagnosis present

## 2020-04-27 LAB — SARS CORONAVIRUS 2 (TAT 6-24 HRS): SARS Coronavirus 2: NEGATIVE

## 2020-04-28 ENCOUNTER — Encounter: Payer: Self-pay | Admitting: Internal Medicine

## 2020-04-29 ENCOUNTER — Ambulatory Visit: Payer: Medicare Other | Admitting: Anesthesiology

## 2020-04-29 ENCOUNTER — Encounter
Admission: RE | Disposition: A | Discharge status: Home / Self Care | Payer: Self-pay | Source: Home / Self Care | Attending: Internal Medicine

## 2020-04-29 ENCOUNTER — Encounter: Payer: Self-pay | Admitting: Internal Medicine

## 2020-04-29 ENCOUNTER — Ambulatory Visit
Admission: RE | Admit: 2020-04-29 | Discharge: 2020-04-29 | Disposition: A | Discharge status: Home / Self Care | Payer: Medicare Other | Attending: Internal Medicine | Admitting: Internal Medicine

## 2020-04-29 DIAGNOSIS — Z800 Family history of malignant neoplasm of digestive organs: Secondary | ICD-10-CM | POA: Insufficient documentation

## 2020-04-29 DIAGNOSIS — Z9049 Acquired absence of other specified parts of digestive tract: Secondary | ICD-10-CM | POA: Diagnosis not present

## 2020-04-29 DIAGNOSIS — Z79899 Other long term (current) drug therapy: Secondary | ICD-10-CM | POA: Diagnosis not present

## 2020-04-29 DIAGNOSIS — Z1211 Encounter for screening for malignant neoplasm of colon: Secondary | ICD-10-CM | POA: Insufficient documentation

## 2020-04-29 DIAGNOSIS — Z7982 Long term (current) use of aspirin: Secondary | ICD-10-CM | POA: Diagnosis not present

## 2020-04-29 DIAGNOSIS — K640 First degree hemorrhoids: Secondary | ICD-10-CM | POA: Diagnosis not present

## 2020-04-29 DIAGNOSIS — M1990 Unspecified osteoarthritis, unspecified site: Secondary | ICD-10-CM | POA: Diagnosis not present

## 2020-04-29 DIAGNOSIS — Z85038 Personal history of other malignant neoplasm of large intestine: Secondary | ICD-10-CM | POA: Diagnosis not present

## 2020-04-29 DIAGNOSIS — K5520 Angiodysplasia of colon without hemorrhage: Secondary | ICD-10-CM | POA: Diagnosis not present

## 2020-04-29 DIAGNOSIS — F17200 Nicotine dependence, unspecified, uncomplicated: Secondary | ICD-10-CM | POA: Diagnosis not present

## 2020-04-29 DIAGNOSIS — I10 Essential (primary) hypertension: Secondary | ICD-10-CM | POA: Insufficient documentation

## 2020-04-29 DIAGNOSIS — Z86010 Personal history of colonic polyps: Secondary | ICD-10-CM | POA: Diagnosis not present

## 2020-04-29 HISTORY — DX: Essential (primary) hypertension: I10

## 2020-04-29 HISTORY — PX: COLONOSCOPY WITH PROPOFOL: SHX5780

## 2020-04-29 HISTORY — DX: Polyp of colon: K63.5

## 2020-04-29 HISTORY — DX: Malignant neoplasm of colon, unspecified: C18.9

## 2020-04-29 HISTORY — DX: Benign neoplasm of colon, unspecified: D12.6

## 2020-04-29 SURGERY — COLONOSCOPY WITH PROPOFOL
Anesthesia: General

## 2020-04-29 MED ORDER — PROPOFOL 500 MG/50ML IV EMUL
INTRAVENOUS | Status: AC
  Filled 2020-04-29: qty 50

## 2020-04-29 MED ORDER — SODIUM CHLORIDE 0.9 % IV SOLN: INTRAVENOUS | Status: DC

## 2020-04-29 MED ORDER — PROPOFOL 500 MG/50ML IV EMUL
INTRAVENOUS | Status: DC | PRN
  Administered 2020-04-29: 120 ug/kg/min via INTRAVENOUS

## 2020-04-29 NOTE — H&P (Signed)
Outpatient short stay form Pre-procedure 04/29/2020 9:13 AM Dana Mcclure K. Alice Reichert, M.D.  Primary Physician: Harrel Lemon, M.D.  Reason for visit: Personal history of colon cancer, Personal history of colon adenomas (2016)  History of present illness:                            Patient presents for colonoscopy for a personal hx of colon cancer (s/p right segmental colectomy, no chemotherapy) and adenomatous colon polyps. The patient denies abdominal pain, abnormal weight loss or rectal bleeding.      Current Facility-Administered Medications:  .  0.9 %  sodium chloride infusion, , Intravenous, Continuous, [place], Benay Pike, MD, Last Rate: 20 mL/hr at 04/29/20 0569, Continued from Pre-op at 04/29/20 0912  Medications Prior to Admission  Medication Sig Dispense Refill Last Dose  . hydrochlorothiazide (HYDRODIURIL) 12.5 MG tablet Take 12.5 mg by mouth daily.   04/28/2020 at Unknown time  . naproxen sodium (ALEVE) 220 MG tablet Take 220 mg by mouth daily as needed.   Past Week at Unknown time  . acetaminophen (TYLENOL) 325 MG tablet Take 650 mg by mouth every 4 (four) hours as needed.     Marland Kitchen aspirin EC 81 MG tablet 81 mg once daily. (Patient not taking: Reported on 04/29/2020)   Not Taking at Unknown time     No Known Allergies   Past Medical History:  Diagnosis Date  . Arthritis   . Cancer ([place])   . Colon cancer ([place])   . Hyperplastic colon polyp   . Hypertension   . Pneumothorax on right   . PUD (peptic ulcer disease)   . Tubular adenoma of colon     Review of systems:  Otherwise negative.    Physical Exam  Gen: Alert, oriented. Appears stated age.  HEENT: [place]/AT. PERRLA. Lungs: CTA, no wheezes. CV: RR nl S1, S2. Abd: soft, benign, no masses. BS+ Ext: No edema. Pulses 2+    Planned procedures: Proceed with colonoscopy. The patient understands the nature of the planned procedure, indications, risks, alternatives and potential complications including but not limited to bleeding,  infection, perforation, damage to internal organs and possible oversedation/side effects from anesthesia. The patient agrees and gives consent to proceed.  Please refer to procedure notes for findings, recommendations and patient disposition/instructions.     Neesa Knapik K. Alice Reichert, M.D. Gastroenterology 04/29/2020  9:13 AM

## 2020-04-29 NOTE — Op Note (Addendum)
[facility] Gastroenterology Patient Name: Dana Mcclure Procedure Date: 04/29/2020 9:10 AM MRN: [id number] Account #: [id number] Date of Birth: November 24, 1945 Admit Type: Outpatient Age: 74 Room: [facility] ENDO ROOM 1 Gender: Female Note Status: Supervisor Override Procedure:             Colonoscopy Indications:           Screening in patient at increased risk: Family history                         of 1st-degree relative with colorectal cancer,                         Surveillance: Personal history of adenomatous polyps                         on last colonoscopy > 5 years ago, Personal history of                         malignant neoplasm of the colon Providers:             Benay Pike. Alice Reichert MD, MD Referring MD:          Baxter Hire, MD (Referring MD) Medicines:             Propofol per Anesthesia Complications:         No immediate complications. Estimated blood loss:                         Minimal. Procedure:             Pre-Anesthesia Assessment:                        - The risks and benefits of the procedure and the                         sedation options and risks were discussed with the                         patient. All questions were answered and informed                         consent was obtained.                        - Patient identification and proposed procedure were                         verified prior to the procedure by the nurse. The                         procedure was verified in the procedure room.                        - ASA Grade Assessment: II - A patient with mild                         systemic disease.                        -  After reviewing the risks and benefits, the patient                         was deemed in satisfactory condition to undergo the                         procedure.                        After obtaining informed consent, the colonoscope was                         passed under direct vision. Throughout  the procedure,                         the patient's blood pressure, pulse, and oxygen                         saturations were monitored continuously. The                         Colonoscope was introduced through the anus and                         advanced to the the ileocolonic anastomosis. The                         colonoscopy was performed without difficulty. The                         patient tolerated the procedure well. The quality of                         the bowel preparation was adequate. Ileocolonic                         anastomosis were photographed. Findings:      The perianal and digital rectal examinations were normal. Pertinent       negatives include normal sphincter tone and no palpable rectal lesions.      There was evidence of a prior end-to-end colo-colonic anastomosis in the       transverse colon. This was patent and was characterized by healthy       appearing mucosa. The anastomosis was traversed.      A single small localized angioectasia with bleeding on contact was found       in the transverse colon. For hemostasis, one hemostatic clip was       successfully placed (MR conditional). There was no bleeding at the end       of the procedure.      Non-bleeding internal hemorrhoids were found during retroflexion. The       hemorrhoids were Grade I (internal hemorrhoids that do not prolapse).      A tattoo was seen in the sigmoid colon. The tattoo site appeared normal. Impression:            - Patent end-to-end colo-colonic anastomosis,                         characterized by healthy appearing mucosa.                        -  A single colonic angioectasia. Clip (MR conditional)                         was placed.                        - Non-bleeding internal hemorrhoids.                        - No specimens collected. Recommendation:        - Patient has a contact number available for                         emergencies. The signs and symptoms of  potential                         delayed complications were discussed with the patient.                         Return to normal activities tomorrow. Written                         discharge instructions were provided to the patient.                        - Resume previous diet.                        - Continue present medications.                        - Repeat colonoscopy in 5 years for surveillance.                        - Return to GI office in 5 years.                        - The findings and recommendations were discussed with                         the patient. Procedure Code(s):     --- Professional ---                        [phone], Colonoscopy, flexible; with control of                         bleeding, any method Diagnosis Code(s):     --- Professional ---                        K64.0, First degree hemorrhoids                        K55.20, Angiodysplasia of colon without hemorrhage                        Z98.0, Intestinal bypass and anastomosis status                        Z86.010, Personal history of colonic polyps  Z80.0, Family history of malignant neoplasm of                         digestive organs CPT copyright 2019 American Medical Association. All rights reserved. The codes documented in this report are preliminary and upon coder review may  be revised to meet current compliance requirements. Efrain Sella MD, MD 04/29/2020 9:44:17 AM This report has been signed electronically. Number of Addenda: 0 Note Initiated On: 04/29/2020 9:10 AM Scope Withdrawal Time: 0 hours 8 minutes 13 seconds  Total Procedure Duration: 0 hours 12 minutes 58 seconds  Estimated Blood Loss:  Estimated blood loss was minimal. Estimated blood loss                         was minimal.      [facility]

## 2020-04-29 NOTE — Progress Notes (Signed)
Noted a bleed coming from possible scope trauma.  Clip applied.  Bleeding stopped

## 2020-04-29 NOTE — Interval H&P Note (Signed)
History and Physical Interval Note:  04/29/2020 9:15 AM  [place]to [place]  has presented today for surgery, with the diagnosis of ph colon ca.  The various methods of treatment have been discussed with the patient and family. After consideration of risks, benefits and other options for treatment, the patient has consented to  Procedure(s): COLONOSCOPY WITH PROPOFOL (N/A) as a surgical intervention.  The patient's history has been reviewed, patient examined, no change in status, stable for surgery.  I have reviewed the patient's chart and labs.  Questions were answered to the patient's satisfaction.     [place], [place]

## 2020-04-29 NOTE — Anesthesia Preprocedure Evaluation (Signed)
Anesthesia Evaluation  Patient identified by MRN, date of birth, ID band Patient awake    Reviewed: Allergy & Precautions, H&P , NPO status , Patient's Chart, lab work & pertinent test results  History of Anesthesia Complications Negative for: history of anesthetic complications  Airway Mallampati: III  TM Distance: >3 FB Neck ROM: Full    Dental  (+) Caps, Poor Dentition, Chipped   Pulmonary neg pulmonary ROS, neg shortness of breath, Current Smoker and Patient abstained from smoking.,    Pulmonary exam normal breath sounds clear to auscultation       Cardiovascular Exercise Tolerance: Good hypertension, (-) Past MI Normal cardiovascular exam Rhythm:regular Rate:Normal     Neuro/Psych TIAnegative psych ROS   GI/Hepatic Neg liver ROS, PUD, neg GERD  ,  Endo/Other  negative endocrine ROS  Renal/GU negative Renal ROS  negative genitourinary   Musculoskeletal  (+) Arthritis ,   Abdominal   Peds  Hematology negative hematology ROS (+)   Anesthesia Other Findings Past Medical History:   Cancer                                                       Arthritis                                                    PUD (peptic ulcer disease)                                   Pneumothorax on right                                        Reproductive/Obstetrics negative OB ROS                             Anesthesia Physical  Anesthesia Plan  ASA: II  Anesthesia Plan: General   Post-op Pain Management:    Induction: Intravenous  PONV Risk Score and Plan: 2 and Ondansetron, Propofol infusion and TIVA  Airway Management Planned: Nasal Cannula  Additional Equipment: None  Intra-op Plan:   Post-operative Plan:   Informed Consent: I have reviewed the patients History and Physical, chart, labs and discussed the procedure including the risks, benefits and alternatives for the proposed  anesthesia with the patient or authorized representative who has indicated his/her understanding and acceptance.     Dental Advisory Given  Plan Discussed with: CRNA and Surgeon  Anesthesia Plan Comments: (Discussed risks of anesthesia with patient, including possibility of difficulty with spontaneous ventilation under anesthesia necessitating airway intervention, PONV, and rare risks such as cardiac or respiratory or neurological events. Patient understands.)        Anesthesia Quick Evaluation

## 2020-04-29 NOTE — Anesthesia Procedure Notes (Signed)
Performed by: Cook-Martin, Rehaan Viloria Pre-anesthesia Checklist: Patient identified, Emergency Drugs available, Suction available, Patient being monitored and Timeout performed Patient Re-evaluated:Patient Re-evaluated prior to induction Oxygen Delivery Method: Nasal cannula Preoxygenation: Pre-oxygenation with 100% oxygen Induction Type: IV induction Placement Confirmation: positive ETCO2 and CO2 detector       

## 2020-04-29 NOTE — Transfer of Care (Signed)
Immediate Anesthesia Transfer of Care Note  Patient: Dana Mcclure  Procedure(s) Performed: COLONOSCOPY WITH PROPOFOL (N/A )  Patient Location: PACU  Anesthesia Type:General  Level of Consciousness: awake and sedated  Airway & Oxygen Therapy: Patient Spontanous Breathing and Patient connected to nasal cannula oxygen  Post-op Assessment: Report given to RN and Post -op Vital signs reviewed and stable  Post vital signs: Reviewed and stable  Last Vitals:  Vitals Value Taken Time  BP    Temp    Pulse    Resp    SpO2      Last Pain:  Vitals:   04/29/20 0816  TempSrc: Temporal         Complications: No complications documented.

## 2020-04-30 ENCOUNTER — Encounter: Payer: Self-pay | Admitting: Internal Medicine

## 2020-04-30 NOTE — Anesthesia Postprocedure Evaluation (Signed)
Anesthesia Post Note  Patient: Dana Mcclure  Procedure(s) Performed: COLONOSCOPY WITH PROPOFOL (N/A )  Patient location during evaluation: Endoscopy Anesthesia Type: General Level of consciousness: awake and alert Pain management: pain level controlled Vital Signs Assessment: post-procedure vital signs reviewed and stable Respiratory status: spontaneous breathing, nonlabored ventilation, respiratory function stable and patient connected to nasal cannula oxygen Cardiovascular status: blood pressure returned to baseline and stable Postop Assessment: no apparent nausea or vomiting Anesthetic complications: no   No complications documented.   Last Vitals:  Vitals:   04/29/20 0941 04/29/20 1001  BP: (!) 109/44 (!) 146/62  Pulse:    Resp: 19   Temp: 36.6 C   SpO2:      Last Pain:  Vitals:   04/29/20 1011  TempSrc:   PainSc: 0-No pain                 Arita Miss

## 2020-08-26 DIAGNOSIS — R7303 Prediabetes: Secondary | ICD-10-CM | POA: Insufficient documentation

## 2022-05-25 ENCOUNTER — Encounter (INDEPENDENT_AMBULATORY_CARE_PROVIDER_SITE_OTHER): Payer: Self-pay | Admitting: Vascular Surgery

## 2022-05-25 ENCOUNTER — Ambulatory Visit (INDEPENDENT_AMBULATORY_CARE_PROVIDER_SITE_OTHER): Payer: Medicare Other | Admitting: Vascular Surgery

## 2022-05-25 VITALS — BP 140/60 | HR 66 | Resp 16 | Wt 195.2 lb

## 2022-05-25 DIAGNOSIS — R0989 Other specified symptoms and signs involving the circulatory and respiratory systems: Secondary | ICD-10-CM | POA: Diagnosis not present

## 2022-05-25 DIAGNOSIS — I10 Essential (primary) hypertension: Secondary | ICD-10-CM

## 2022-05-25 DIAGNOSIS — I739 Peripheral vascular disease, unspecified: Secondary | ICD-10-CM | POA: Diagnosis not present

## 2022-05-25 DIAGNOSIS — M79604 Pain in right leg: Secondary | ICD-10-CM | POA: Diagnosis not present

## 2022-05-25 DIAGNOSIS — M79605 Pain in left leg: Secondary | ICD-10-CM

## 2022-05-25 DIAGNOSIS — E782 Mixed hyperlipidemia: Secondary | ICD-10-CM

## 2022-05-25 NOTE — Progress Notes (Signed)
MRN : [id number]  Dana Mcclure is a 76 y.o. (May 27, 1946) female who presents with chief complaint of check circulation.  History of Present Illness:   The patient is seen for evaluation of painful lower extremities. Patient notes the pain is variable and not always associated with activity.  The pain is somewhat consistent day to day occurring on most days. The patient notes the pain also occurs with standing and routinely seems worse as the day wears on. The pain has been progressive over the past several years. The patient states these symptoms are causing  a negative impact on quality of life and daily activities which was a factor in the referral.  The patient has a  history of back problems and DJD of the lumbar and sacral spine.   The patient denies rest pain or dangling of an extremity off the side of the bed during the night for relief. No open wounds or sores at this time. No history of DVT or phlebitis. No prior vascular interventions or surgeries.    Current Meds  Medication Sig   acetaminophen (TYLENOL) 325 MG tablet Take 650 mg by mouth every 4 (four) hours as needed.   amLODipine (NORVASC) 10 MG tablet Take 10 mg by mouth daily.   hydrochlorothiazide (HYDRODIURIL) 12.5 MG tablet Take 12.5 mg by mouth daily.   naproxen sodium (ALEVE) 220 MG tablet Take 220 mg by mouth daily as needed.    Past Medical History:  Diagnosis Date   Arthritis    Cancer ([place])    Colon cancer ([place])    Hyperplastic colon polyp    Hypertension    Pneumothorax on right    PUD (peptic ulcer disease)    Tubular adenoma of colon     Past Surgical History:  Procedure Laterality Date   CHOLECYSTECTOMY     COLON SURGERY     COLONOSCOPY  2017   COLONOSCOPY WITH PROPOFOL N/A 07/09/2015   Procedure: COLONOSCOPY WITH PROPOFOL;  Surgeon: Lollie Sails, MD;  Location: [facility] ENDOSCOPY;  Service: Endoscopy;  Laterality: N/A;   COLONOSCOPY WITH PROPOFOL N/A 04/29/2020    Procedure: COLONOSCOPY WITH PROPOFOL;  Surgeon: Toledo, Benay Pike, MD;  Location: [facility] ENDOSCOPY;  Service: Gastroenterology;  Laterality: N/A;   EXPLORATORY LAPAROTOMY      Social History Social History   Tobacco Use   Smoking status: Every Day    Packs/day: 0.50    Types: Cigarettes    Last attempt to quit: 08/08/2016    Years since quitting: 5.7   Smokeless tobacco: Never  Substance Use Topics   Alcohol use: Not Currently   Drug use: Never    Family History Family History  Problem Relation Age of Onset   Colon polyps Daughter    Colon polyps Son    Breast cancer Neg Hx     No Known Allergies   REVIEW OF SYSTEMS (Negative unless checked)  Constitutional: '[]'$ Weight loss  '[]'$ Fever  '[]'$ Chills Cardiac: '[]'$ Chest pain   '[]'$ Chest pressure   '[]'$ Palpitations   '[]'$ Shortness of breath when laying flat   '[]'$ Shortness of breath with exertion. Vascular:  '[x]'$ Pain in legs with walking   '[]'$ Pain in legs at rest  '[]'$ History of DVT   '[]'$ Phlebitis   '[]'$ Swelling in legs   '[]'$ Varicose veins   '[]'$ Non-healing ulcers Pulmonary:   '[]'$ Uses home oxygen   '[]'$ Productive cough   '[]'$ Hemoptysis   '[]'$ Wheeze  '[]'$ COPD   '[]'$   Asthma Neurologic:  '[]'$ Dizziness   '[]'$ Seizures   '[]'$ History of stroke   '[]'$ History of TIA  '[]'$ Aphasia   '[]'$ Vissual changes   '[]'$ Weakness or numbness in arm   '[x]'$ Weakness or numbness in leg Musculoskeletal:   '[]'$ Joint swelling   '[x]'$ Joint pain   '[x]'$ Low back pain Hematologic:  '[]'$ Easy bruising  '[]'$ Easy bleeding   '[]'$ Hypercoagulable state   '[]'$ Anemic Gastrointestinal:  '[]'$ Diarrhea   '[]'$ Vomiting  '[]'$ Gastroesophageal reflux/heartburn   '[]'$ Difficulty swallowing. Genitourinary:  '[]'$ Chronic kidney disease   '[]'$ Difficult urination  '[]'$ Frequent urination   '[]'$ Blood in urine Skin:  '[]'$ Rashes   '[]'$ Ulcers  Psychological:  '[]'$ History of anxiety   '[]'$  History of major depression.  Physical Examination  Vitals:   05/25/22 1344  BP: (!) 140/60  Pulse: 66  Resp: 16  Weight: 195 lb 3.2 oz (88.5 kg)   Body mass index is 33.51 kg/m. Gen:  WD/WN, NAD Head: [place]/AT, No temporalis wasting.  Ear/Nose/Throat: Hearing grossly intact, nares w/o erythema or drainage Eyes: PER, EOMI, sclera nonicteric.  Neck: Supple, no masses.  No bruit or JVD.  Pulmonary:  Good air movement, no audible wheezing, no use of accessory muscles.  Cardiac: RRR, normal S1, S2, no Murmurs. Vascular:  mild trophic changes, no open wounds Vessel Right Left  Radial Palpable Palpable  PT Not Palpable Not Palpable  DP Trace Palpable Trace Palpable  Gastrointestinal: soft, non-distended. No guarding/no peritoneal signs.  Musculoskeletal: M/S 5/5 throughout.  No visible deformity.  Neurologic: CN 2-12 intact. Pain and light touch intact in extremities.  Symmetrical.  Speech is fluent. Motor exam as listed above. Psychiatric: Judgment intact, Mood & affect appropriate for pt's clinical situation. Dermatologic: No rashes or ulcers noted.  No changes consistent with cellulitis.   CBC Lab Results  Component Value Date   WBC 9.7 08/28/2016   HGB 14.9 08/28/2016   HCT 42.5 08/28/2016   MCV 89.1 08/28/2016   PLT 308 08/28/2016    BMET No results found for: "NA", "K", "CL", "CO2", "GLUCOSE", "BUN", "CREATININE", "CALCIUM", "GFRNONAA", "GFRAA" CrCl cannot be calculated (No successful lab value found.).  COAG No results found for: "INR", "PROTIME"  Radiology No results found.   Assessment/Plan 1. Pain in both lower extremities  Recommend:  The patient has atypical pain symptoms for pure atherosclerotic disease. However, on physical exam there is evidence of vascular disease, given the diminished pulses of the legs.  Further investigation of the patient's vascular disease is necessary to determine the relationship of the patient's lower extremity symptoms and the degree of vascular disease.  Noninvasive studies of the will be obtained and the patient will follow up with me to review these studies.  I suspect the patient is c/o pseudoclaudication.   Patient should have an evaluation of his LS spine which I defer to either the primary service or the Spine service.  The patient should continue walking and begin a more formal exercise program. The patient should continue his antiplatelet therapy and aggressive treatment of the lipid abnormalities.  - VAS [place] ABI WITH/WO TBI; Future  2. PAD (peripheral artery disease) ([place]) See #1 - VAS [place] ABI WITH/WO TBI; Future  3. Bruit of left carotid artery Recommend:  Given the patient's asymptomatic carotid bruit no invasive testing or surgery at this time.  Duplex ultrasound should be obtained to assess the degree of stenosis bilaterally.  Continue antiplatelet therapy as prescribed Continue management of CAD, HTN and Hyperlipidemia Healthy heart diet,  encouraged exercise at least 4 times per week Follow up in  1 months with duplex ultrasound and physical exam   - VAS US CAROTID; Future  4. Mixed hyperlipidemia Continue statin as ordered and reviewed, no changes at this time   5. Hypertension, essential Continue antihypertensive medications as already ordered, these medications have been reviewed and there are no changes at this time.     Hortencia Pilar, MD  05/25/2022 1:55 PM

## 2022-05-26 ENCOUNTER — Other Ambulatory Visit (INDEPENDENT_AMBULATORY_CARE_PROVIDER_SITE_OTHER): Payer: Self-pay | Admitting: Vascular Surgery

## 2022-05-26 DIAGNOSIS — R29898 Other symptoms and signs involving the musculoskeletal system: Secondary | ICD-10-CM

## 2022-05-27 ENCOUNTER — Encounter (INDEPENDENT_AMBULATORY_CARE_PROVIDER_SITE_OTHER): Payer: Self-pay | Admitting: Vascular Surgery

## 2022-05-27 DIAGNOSIS — I70219 Atherosclerosis of native arteries of extremities with intermittent claudication, unspecified extremity: Secondary | ICD-10-CM | POA: Insufficient documentation

## 2022-05-27 DIAGNOSIS — I739 Peripheral vascular disease, unspecified: Secondary | ICD-10-CM | POA: Insufficient documentation

## 2022-05-27 DIAGNOSIS — R0989 Other specified symptoms and signs involving the circulatory and respiratory systems: Secondary | ICD-10-CM | POA: Insufficient documentation

## 2022-05-27 DIAGNOSIS — M79606 Pain in leg, unspecified: Secondary | ICD-10-CM | POA: Insufficient documentation

## 2022-05-29 ENCOUNTER — Encounter (INDEPENDENT_AMBULATORY_CARE_PROVIDER_SITE_OTHER): Payer: Self-pay | Admitting: Nurse Practitioner

## 2022-05-29 ENCOUNTER — Ambulatory Visit (INDEPENDENT_AMBULATORY_CARE_PROVIDER_SITE_OTHER): Payer: Medicare Other | Admitting: Nurse Practitioner

## 2022-05-29 ENCOUNTER — Ambulatory Visit (INDEPENDENT_AMBULATORY_CARE_PROVIDER_SITE_OTHER): Payer: Medicare Other

## 2022-05-29 VITALS — BP 133/69 | HR 65 | Resp 18 | Ht 64.0 in | Wt 193.0 lb

## 2022-05-29 DIAGNOSIS — I10 Essential (primary) hypertension: Secondary | ICD-10-CM | POA: Diagnosis not present

## 2022-05-29 DIAGNOSIS — I70219 Atherosclerosis of native arteries of extremities with intermittent claudication, unspecified extremity: Secondary | ICD-10-CM

## 2022-05-29 DIAGNOSIS — R29898 Other symptoms and signs involving the musculoskeletal system: Secondary | ICD-10-CM | POA: Diagnosis not present

## 2022-05-29 DIAGNOSIS — Z720 Tobacco use: Secondary | ICD-10-CM

## 2022-05-29 DIAGNOSIS — E782 Mixed hyperlipidemia: Secondary | ICD-10-CM | POA: Diagnosis not present

## 2022-05-29 DIAGNOSIS — R0989 Other specified symptoms and signs involving the circulatory and respiratory systems: Secondary | ICD-10-CM

## 2022-05-30 ENCOUNTER — Encounter (INDEPENDENT_AMBULATORY_CARE_PROVIDER_SITE_OTHER): Payer: Self-pay | Admitting: Nurse Practitioner

## 2022-05-30 NOTE — H&P (View-Only) (Signed)
Subjective:    Patient ID: Dana Mcclure, female    DOB: November 08, 1945, 76 y.o.   MRN: [id number] No chief complaint on file.   Dana Mcclure is a 76 year old female that returns today for follow-up of noninvasive studies.  It was noted that she had a left carotid bruit.  The initial consult however was due to severe weakness and claudication of her bilateral lower extremities.  She notes that this has been ongoing for years but in the recent months it has worsened progressively.  The patient is barely able to walk several steps before she begins to experience severe disabling claudication-like symptoms.  She also has profound weakness which affects her ability to perform her activities of daily living.  She notes that her quality of life has been severely diminished due to the symptoms.  Currently there are no open wounds or ulcerations.  She denies rest pain at this time.  Today noninvasive studies show no evidence of significant stenosis in the bilateral internal carotid arteries.  The bilateral vertebral arteries have antegrade flow with normal flow hemodynamics in the bilateral subclavian arteries.  The patient has a right ABI of 0.60 at the left ABI 0.43.  Additional duplex images show the distal aorta has low monophasic flow.  No evidence of an abdominal aortic aneurysm noted.  It is also noted that in the bilateral common femoral arteries there is mild flow and plaque suggesting inflow disease.    Review of Systems  Cardiovascular:        Claudication  All other systems reviewed and are negative.      Objective:   Physical Exam Vitals reviewed.  HENT:     Head: Normocephalic.  Neck:     Vascular: Carotid bruit present.  Cardiovascular:     Rate and Rhythm: Normal rate.     Pulses:          Dorsalis pedis pulses are detected w/ Doppler on the right side and detected w/ Doppler on the left side.       Posterior tibial pulses are detected w/ Doppler on the right side and  detected w/ Doppler on the left side.  Pulmonary:     Effort: Pulmonary effort is normal.  Neurological:     Mental Status: She is alert.  Psychiatric:        Mood and Affect: Mood normal.        Behavior: Behavior normal.        Thought Content: Thought content normal.        Judgment: Judgment normal.     BP 133/69 (BP Location: Right Arm)   Pulse 65   Resp 18   Ht '5\' 4"'$  (1.626 m)   Wt 193 lb (87.5 kg)   BMI 33.13 kg/m   Past Medical History:  Diagnosis Date   Arthritis    Cancer ([place])    Colon cancer ([place])    Hyperplastic colon polyp    Hypertension    Pneumothorax on right    PUD (peptic ulcer disease)    Tubular adenoma of colon     Social History   Socioeconomic History   Marital status: Married    Spouse name: Not on file   Number of children: Not on file   Years of education: Not on file   Highest education level: Not on file  Occupational History   Not on file  Tobacco Use   Smoking status: Every Day    Packs/day: 0.50  Types: Cigarettes    Last attempt to quit: 08/08/2016    Years since quitting: 5.8   Smokeless tobacco: Never  Substance and Sexual Activity   Alcohol use: Not Currently   Drug use: Never   Sexual activity: Not on file  Other Topics Concern   Not on file  Social History Narrative   Not on file   Social Determinants of Health   Financial Resource Strain: Not on file  Food Insecurity: Not on file  Transportation Needs: Not on file  Physical Activity: Not on file  Stress: Not on file  Social Connections: Not on file  Intimate Partner Violence: Not on file    Past Surgical History:  Procedure Laterality Date   CHOLECYSTECTOMY     COLON SURGERY     COLONOSCOPY  2017   COLONOSCOPY WITH PROPOFOL N/A 07/09/2015   Procedure: COLONOSCOPY WITH PROPOFOL;  Surgeon: Lollie Sails, MD;  Location: [facility] ENDOSCOPY;  Service: Endoscopy;  Laterality: N/A;   COLONOSCOPY WITH PROPOFOL N/A 04/29/2020   Procedure: COLONOSCOPY WITH  PROPOFOL;  Surgeon: Toledo, Benay Pike, MD;  Location: [facility] ENDOSCOPY;  Service: Gastroenterology;  Laterality: N/A;   EXPLORATORY LAPAROTOMY      Family History  Problem Relation Age of Onset   Colon polyps Daughter    Colon polyps Son    Breast cancer Neg Hx     No Known Allergies     Latest Ref Rng & Units 08/28/2016    4:05 PM  CBC  WBC 3.6 - 11.0 K/uL 9.7   Hemoglobin 12.0 - 16.0 g/dL 14.9   Hematocrit 35.0 - 47.0 % 42.5   Platelets 150 - 440 K/uL 308       CMP  No results found for: "NA", "K", "CL", "CO2", "GLUCOSE", "BUN", "CREATININE", "CALCIUM", "PROT", "ALBUMIN", "AST", "ALT", "ALKPHOS", "BILITOT", "GFRNONAA", "GFRAA"   No results found.     Assessment & Plan:   1. Bruit of left carotid artery Today noninvasive studies show no significant evidence of disease in her bilateral carotid arteries.  Based on this follow-up would be done on an as-needed basis. - VAS US CAROTID  2. Mixed hyperlipidemia Continue statin as ordered and reviewed, no changes at this time   3. Hypertension, essential Continue antihypertensive medications as already ordered, these medications have been reviewed and there are no changes at this time.   4. Tobacco abuse Smoking cessation was discussed, 3-10 minutes spent on this topic specifically   5. Atherosclerotic peripheral vascular disease with intermittent claudication (HCC) Recommend:  The patient has experienced increased claudication symptoms and is now describing lifestyle limiting claudication and appears to be having mild rest pain symptroms.  Given the severity of the patient's severe bilateral lower extremity symptoms the patient should undergo angiography with the hope for intervention.  Risk and benefits were reviewed the patient.  Indications for the procedure were reviewed.  All questions were answered, the patient agrees to proceed with a left followed by right lower extremity angiography and possible intervention.    The patient should continue walking and begin a more formal exercise program.  The patient should continue antiplatelet therapy and aggressive treatment of the lipid abnormalities  The patient will follow up with me after the angiogram.     Current Outpatient Medications on File Prior to Visit  Medication Sig Dispense Refill   acetaminophen (TYLENOL) 325 MG tablet Take 650 mg by mouth every 4 (four) hours as needed.     amLODipine (NORVASC) 10 MG tablet  Take 10 mg by mouth daily.     hydrochlorothiazide (HYDRODIURIL) 12.5 MG tablet Take 12.5 mg by mouth daily.     naproxen sodium (ALEVE) 220 MG tablet Take 220 mg by mouth daily as needed.     aspirin EC 81 MG tablet 81 mg once daily. (Patient not taking: No sig reported)     No current facility-administered medications on file prior to visit.    There are no Patient Instructions on file for this visit. No follow-ups on file.   Kris Hartmann, NP

## 2022-05-30 NOTE — Progress Notes (Signed)
Subjective:    Patient ID: Dana Mcclure, female    DOB: 18-Apr-1946, 76 y.o.   MRN: [id number] No chief complaint on file.   Dana Mcclure is a 76 year old female that returns today for follow-up of noninvasive studies.  It was noted that she had a left carotid bruit.  The initial consult however was due to severe weakness and claudication of her bilateral lower extremities.  She notes that this has been ongoing for years but in the recent months it has worsened progressively.  The patient is barely able to walk several steps before she begins to experience severe disabling claudication-like symptoms.  She also has profound weakness which affects her ability to perform her activities of daily living.  She notes that her quality of life has been severely diminished due to the symptoms.  Currently there are no open wounds or ulcerations.  She denies rest pain at this time.  Today noninvasive studies show no evidence of significant stenosis in the bilateral internal carotid arteries.  The bilateral vertebral arteries have antegrade flow with normal flow hemodynamics in the bilateral subclavian arteries.  The patient has a right ABI of 0.60 at the left ABI 0.43.  Additional duplex images show the distal aorta has low monophasic flow.  No evidence of an abdominal aortic aneurysm noted.  It is also noted that in the bilateral common femoral arteries there is mild flow and plaque suggesting inflow disease.    Review of Systems  Cardiovascular:        Claudication  All other systems reviewed and are negative.      Objective:   Physical Exam Vitals reviewed.  HENT:     Head: Normocephalic.  Neck:     Vascular: Carotid bruit present.  Cardiovascular:     Rate and Rhythm: Normal rate.     Pulses:          Dorsalis pedis pulses are detected w/ Doppler on the right side and detected w/ Doppler on the left side.       Posterior tibial pulses are detected w/ Doppler on the right side and  detected w/ Doppler on the left side.  Pulmonary:     Effort: Pulmonary effort is normal.  Neurological:     Mental Status: She is alert.  Psychiatric:        Mood and Affect: Mood normal.        Behavior: Behavior normal.        Thought Content: Thought content normal.        Judgment: Judgment normal.     BP 133/69 (BP Location: Right Arm)   Pulse 65   Resp 18   Ht '5\' 4"'$  (1.626 m)   Wt 193 lb (87.5 kg)   BMI 33.13 kg/m   Past Medical History:  Diagnosis Date   Arthritis    Cancer ([place])    Colon cancer ([place])    Hyperplastic colon polyp    Hypertension    Pneumothorax on right    PUD (peptic ulcer disease)    Tubular adenoma of colon     Social History   Socioeconomic History   Marital status: Married    Spouse name: Not on file   Number of children: Not on file   Years of education: Not on file   Highest education level: Not on file  Occupational History   Not on file  Tobacco Use   Smoking status: Every Day    Packs/day: 0.50  Types: Cigarettes    Last attempt to quit: 08/08/2016    Years since quitting: 5.8   Smokeless tobacco: Never  Substance and Sexual Activity   Alcohol use: Not Currently   Drug use: Never   Sexual activity: Not on file  Other Topics Concern   Not on file  Social History Narrative   Not on file   Social Determinants of Health   Financial Resource Strain: Not on file  Food Insecurity: Not on file  Transportation Needs: Not on file  Physical Activity: Not on file  Stress: Not on file  Social Connections: Not on file  Intimate Partner Violence: Not on file    Past Surgical History:  Procedure Laterality Date   CHOLECYSTECTOMY     COLON SURGERY     COLONOSCOPY  2017   COLONOSCOPY WITH PROPOFOL N/A 07/09/2015   Procedure: COLONOSCOPY WITH PROPOFOL;  Surgeon: Lollie Sails, MD;  Location[facility] ENDOSCOPY;  Service: Endoscopy;  Laterality: N/A;   COLONOSCOPY WITH PROPOFOL N/A 04/29/2020   Procedure: COLONOSCOPY WITH  PROPOFOL;  Surgeon: Toledo, Benay Pike, MD;  Location: [facility] ENDOSCOPY;  Service: Gastroenterology;  Laterality: N/A;   EXPLORATORY LAPAROTOMY      Family History  Problem Relation Age of Onset   Colon polyps Daughter    Colon polyps Son    Breast cancer Neg Hx     No Known Allergies     Latest Ref Rng & Units 08/28/2016    4:05 PM  CBC  WBC 3.6 - 11.0 K/uL 9.7   Hemoglobin 12.0 - 16.0 g/dL 14.9   Hematocrit 35.0 - 47.0 % 42.5   Platelets 150 - 440 K/uL 308       CMP  No results found for: "NA", "K", "CL", "CO2", "GLUCOSE", "BUN", "CREATININE", "CALCIUM", "PROT", "ALBUMIN", "AST", "ALT", "ALKPHOS", "BILITOT", "GFRNONAA", "GFRAA"   No results found.     Assessment & Plan:   1. Bruit of left carotid artery Today noninvasive studies show no significant evidence of disease in her bilateral carotid arteries.  Based on this follow-up would be done on an as-needed basis. - VAS US CAROTID  2. Mixed hyperlipidemia Continue statin as ordered and reviewed, no changes at this time   3. Hypertension, essential Continue antihypertensive medications as already ordered, these medications have been reviewed and there are no changes at this time.   4. Tobacco abuse Smoking cessation was discussed, 3-10 minutes spent on this topic specifically   5. Atherosclerotic peripheral vascular disease with intermittent claudication (HCC) Recommend:  The patient has experienced increased claudication symptoms and is now describing lifestyle limiting claudication and appears to be having mild rest pain symptroms.  Given the severity of the patient's severe bilateral lower extremity symptoms the patient should undergo angiography with the hope for intervention.  Risk and benefits were reviewed the patient.  Indications for the procedure were reviewed.  All questions were answered, the patient agrees to proceed with a left followed by right lower extremity angiography and possible intervention.    The patient should continue walking and begin a more formal exercise program.  The patient should continue antiplatelet therapy and aggressive treatment of the lipid abnormalities  The patient will follow up with me after the angiogram.     Current Outpatient Medications on File Prior to Visit  Medication Sig Dispense Refill   acetaminophen (TYLENOL) 325 MG tablet Take 650 mg by mouth every 4 (four) hours as needed.     amLODipine (NORVASC) 10 MG tablet  Take 10 mg by mouth daily.     hydrochlorothiazide (HYDRODIURIL) 12.5 MG tablet Take 12.5 mg by mouth daily.     naproxen sodium (ALEVE) 220 MG tablet Take 220 mg by mouth daily as needed.     aspirin EC 81 MG tablet 81 mg once daily. (Patient not taking: No sig reported)     No current facility-administered medications on file prior to visit.    There are no Patient Instructions on file for this visit. No follow-ups on file.   Kris Hartmann, NP

## 2022-06-05 ENCOUNTER — Telehealth (INDEPENDENT_AMBULATORY_CARE_PROVIDER_SITE_OTHER): Payer: Self-pay

## 2022-06-05 NOTE — Telephone Encounter (Signed)
Spoke with the patient and she is scheduled with Dr. Delana Meyer on 06/06/22 with a 6:45 am arrival time to the [facility]. Pre-procedure instructions were discussed and patient stated she wrote them down.

## 2022-06-06 ENCOUNTER — Encounter
Admission: RE | Disposition: A | Discharge status: Home / Self Care | Payer: Self-pay | Source: Home / Self Care | Attending: Vascular Surgery

## 2022-06-06 ENCOUNTER — Ambulatory Visit
Admission: RE | Admit: 2022-06-06 | Discharge: 2022-06-06 | Disposition: A | Discharge status: Home / Self Care | Payer: Medicare Other | Attending: Vascular Surgery | Admitting: Vascular Surgery

## 2022-06-06 ENCOUNTER — Encounter: Payer: Self-pay | Admitting: Vascular Surgery

## 2022-06-06 ENCOUNTER — Other Ambulatory Visit: Payer: Self-pay

## 2022-06-06 DIAGNOSIS — R0989 Other specified symptoms and signs involving the circulatory and respiratory systems: Secondary | ICD-10-CM | POA: Diagnosis not present

## 2022-06-06 DIAGNOSIS — E782 Mixed hyperlipidemia: Secondary | ICD-10-CM | POA: Diagnosis not present

## 2022-06-06 DIAGNOSIS — I70213 Atherosclerosis of native arteries of extremities with intermittent claudication, bilateral legs: Secondary | ICD-10-CM

## 2022-06-06 DIAGNOSIS — I70223 Atherosclerosis of native arteries of extremities with rest pain, bilateral legs: Secondary | ICD-10-CM | POA: Insufficient documentation

## 2022-06-06 DIAGNOSIS — I708 Atherosclerosis of other arteries: Secondary | ICD-10-CM | POA: Diagnosis not present

## 2022-06-06 DIAGNOSIS — I700 Atherosclerosis of aorta: Secondary | ICD-10-CM | POA: Diagnosis not present

## 2022-06-06 DIAGNOSIS — I10 Essential (primary) hypertension: Secondary | ICD-10-CM | POA: Diagnosis not present

## 2022-06-06 DIAGNOSIS — I70219 Atherosclerosis of native arteries of extremities with intermittent claudication, unspecified extremity: Secondary | ICD-10-CM

## 2022-06-06 DIAGNOSIS — Z87891 Personal history of nicotine dependence: Secondary | ICD-10-CM | POA: Insufficient documentation

## 2022-06-06 HISTORY — PX: LOWER EXTREMITY ANGIOGRAPHY: CATH118251

## 2022-06-06 LAB — CREATININE, SERUM
Creatinine, Ser: 0.92 mg/dL (ref 0.44–1.00)
GFR, Estimated: >60 mL/min (ref >60)

## 2022-06-06 LAB — BUN: BUN: 13 mg/dL (ref 8–23)

## 2022-06-06 SURGERY — LOWER EXTREMITY ANGIOGRAPHY
Anesthesia: Moderate Sedation | Site: Leg Lower | Laterality: Left

## 2022-06-06 MED ORDER — ONDANSETRON HCL 4 MG/2ML IJ SOLN: 4.0000 mg | Freq: Four times a day (QID) | INTRAMUSCULAR | Status: DC | PRN

## 2022-06-06 MED ORDER — ONDANSETRON HCL 4 MG/2ML IJ SOLN
4.0000 mg | Freq: Four times a day (QID) | INTRAMUSCULAR | Status: DC | PRN
Start: 2022-06-06 — End: 2022-06-06

## 2022-06-06 MED ORDER — ASPIRIN 325 MG PO TBEC
DELAYED_RELEASE_TABLET | ORAL | Status: AC
  Administered 2022-06-06: 325 mg via ORAL
  Filled 2022-06-06: qty 1

## 2022-06-06 MED ORDER — OXYCODONE HCL 5 MG PO TABS: 5.0000 mg | ORAL_TABLET | ORAL | Status: DC | PRN

## 2022-06-06 MED ORDER — ATORVASTATIN CALCIUM 10 MG PO TABS: 10.0000 mg | ORAL_TABLET | Freq: Every day | ORAL | 3 refills | Status: DC

## 2022-06-06 MED ORDER — SODIUM CHLORIDE 0.9% FLUSH: 3.0000 mL | INTRAVENOUS | Status: DC | PRN

## 2022-06-06 MED ORDER — MIDAZOLAM HCL 2 MG/2ML IJ SOLN
INTRAMUSCULAR | Status: AC
  Filled 2022-06-06: qty 2

## 2022-06-06 MED ORDER — CLOPIDOGREL BISULFATE 75 MG PO TABS
ORAL_TABLET | ORAL | Status: AC
  Administered 2022-06-06: 300 mg via ORAL
  Filled 2022-06-06: qty 4

## 2022-06-06 MED ORDER — DIPHENHYDRAMINE HCL 50 MG/ML IJ SOLN: 50.0000 mg | Freq: Once | INTRAMUSCULAR | Status: DC | PRN

## 2022-06-06 MED ORDER — MORPHINE SULFATE (PF) 4 MG/ML IV SOLN: 2.0000 mg | INTRAVENOUS | Status: DC | PRN

## 2022-06-06 MED ORDER — HYDROMORPHONE HCL 1 MG/ML IJ SOLN: 1.0000 mg | Freq: Once | INTRAMUSCULAR | Status: DC | PRN

## 2022-06-06 MED ORDER — FENTANYL CITRATE PF 50 MCG/ML IJ SOSY
PREFILLED_SYRINGE | INTRAMUSCULAR | Status: AC
  Filled 2022-06-06: qty 1

## 2022-06-06 MED ORDER — FENTANYL CITRATE (PF) 100 MCG/2ML IJ SOLN
INTRAMUSCULAR | Status: DC | PRN
  Administered 2022-06-06 (×3): 25 ug via INTRAVENOUS

## 2022-06-06 MED ORDER — SODIUM CHLORIDE 0.9% FLUSH: 3.0000 mL | Freq: Two times a day (BID) | INTRAVENOUS | Status: DC

## 2022-06-06 MED ORDER — IODIXANOL 320 MG/ML IV SOLN
INTRAVENOUS | Status: DC | PRN
  Administered 2022-06-06: 80 mL

## 2022-06-06 MED ORDER — CLOPIDOGREL BISULFATE 75 MG PO TABS: 75.0000 mg | ORAL_TABLET | Freq: Every day | ORAL | 5 refills | Status: DC

## 2022-06-06 MED ORDER — ASPIRIN 81 MG PO TBEC
325.0000 mg | DELAYED_RELEASE_TABLET | ORAL | Status: AC
Start: 2022-06-06 — End: 2022-06-06

## 2022-06-06 MED ORDER — HEPARIN SODIUM (PORCINE) 1000 UNIT/ML IJ SOLN
INTRAMUSCULAR | Status: DC | PRN
  Administered 2022-06-06: 6000 [IU] via INTRAVENOUS

## 2022-06-06 MED ORDER — CLOPIDOGREL BISULFATE 300 MG PO TABS: 300.0000 mg | ORAL_TABLET | ORAL | Status: AC

## 2022-06-06 MED ORDER — ACETAMINOPHEN 325 MG PO TABS
650.0000 mg | ORAL_TABLET | ORAL | Status: DC | PRN
Start: 2022-06-06 — End: 2022-06-06

## 2022-06-06 MED ORDER — FAMOTIDINE 20 MG PO TABS: 40.0000 mg | ORAL_TABLET | Freq: Once | ORAL | Status: DC | PRN

## 2022-06-06 MED ORDER — SODIUM CHLORIDE 0.9 % IV SOLN
INTRAVENOUS | Status: DC
Start: 2022-06-06 — End: 2022-06-06

## 2022-06-06 MED ORDER — SODIUM CHLORIDE 0.9 % IV SOLN: INTRAVENOUS | Status: DC

## 2022-06-06 MED ORDER — MIDAZOLAM HCL 2 MG/ML PO SYRP: 8.0000 mg | ORAL_SOLUTION | Freq: Once | ORAL | Status: DC | PRN

## 2022-06-06 MED ORDER — HEPARIN SODIUM (PORCINE) 1000 UNIT/ML IJ SOLN
INTRAMUSCULAR | Status: AC
  Filled 2022-06-06: qty 10

## 2022-06-06 MED ORDER — METHYLPREDNISOLONE SODIUM SUCC 125 MG IJ SOLR: 125.0000 mg | Freq: Once | INTRAMUSCULAR | Status: DC | PRN

## 2022-06-06 MED ORDER — CEFAZOLIN SODIUM-DEXTROSE 2-4 GM/100ML-% IV SOLN
INTRAVENOUS | Status: AC
  Administered 2022-06-06: 2 g via INTRAVENOUS
  Filled 2022-06-06: qty 100

## 2022-06-06 MED ORDER — MIDAZOLAM HCL 2 MG/2ML IJ SOLN
INTRAMUSCULAR | Status: DC | PRN
  Administered 2022-06-06: 2 mg via INTRAVENOUS
  Administered 2022-06-06 (×3): 1 mg via INTRAVENOUS

## 2022-06-06 MED ORDER — HYDRALAZINE HCL 20 MG/ML IJ SOLN: 5.0000 mg | INTRAMUSCULAR | Status: DC | PRN

## 2022-06-06 MED ORDER — CEFAZOLIN SODIUM-DEXTROSE 2-4 GM/100ML-% IV SOLN: 2.0000 g | INTRAVENOUS | Status: AC

## 2022-06-06 MED ORDER — LABETALOL HCL 5 MG/ML IV SOLN: 10.0000 mg | INTRAVENOUS | Status: DC | PRN

## 2022-06-06 MED ORDER — SODIUM CHLORIDE 0.9 % IV SOLN: 250.0000 mL | INTRAVENOUS | Status: DC | PRN

## 2022-06-06 SURGICAL SUPPLY — 20 items
BALLN LUTONIX DCB 6X60X130 (BALLOONS) ×2
BALLN LUTONIX DCB 6X80X130 (BALLOONS) ×2
BALLOON LUTONIX DCB 6X60X130 (BALLOONS) IMPLANT
BALLOON LUTONIX DCB 6X80X130 (BALLOONS) IMPLANT
CATH ANGIO 5F PIGTAIL 65CM (CATHETERS) ×1 IMPLANT
CATH BEACON 5 .035 40 KMP TP (CATHETERS) IMPLANT
CATH BEACON 5 .038 40 KMP TP (CATHETERS) ×1
DEVICE STARCLOSE SE CLOSURE (Vascular Products) ×2 IMPLANT
GLIDEWIRE ADV .035X260CM (WIRE) ×2 IMPLANT
INTRODUCER 7FR 23CM (INTRODUCER) ×2 IMPLANT
KIT ENCORE 26 ADVANTAGE (KITS) ×2 IMPLANT
NDL ENTRY 21GA 7CM ECHOTIP (NEEDLE) IMPLANT
NEEDLE ENTRY 21GA 7CM ECHOTIP (NEEDLE) ×2 IMPLANT
PACK ANGIOGRAPHY (CUSTOM PROCEDURE TRAY) ×2 IMPLANT
SET INTRO CAPELLA COAXIAL (SET/KITS/TRAYS/PACK) ×1 IMPLANT
SHEATH BRITE TIP 5FRX11 (SHEATH) ×2 IMPLANT
SHEATH PROBE COVER 6X72 (BAG) ×1 IMPLANT
STENT LIFESTREAM 7X37X80 (Permanent Stent) ×1 IMPLANT
STENT LIFESTREAM 7X58X80 (Permanent Stent) ×2 IMPLANT
WIRE GUIDERIGHT .035X150 (WIRE) ×1 IMPLANT

## 2022-06-06 NOTE — Interval H&P Note (Signed)
History and Physical Interval Note:  06/06/2022 7:46 AM  Dana Mcclure  has presented today for surgery, with the diagnosis of LLE Angio   BARD   ASO w claudication.  The various methods of treatment have been discussed with the patient and family. After consideration of risks, benefits and other options for treatment, the patient has consented to  Procedure(s): Lower Extremity Angiography (Left) as a surgical intervention.  The patient's history has been reviewed, patient examined, no change in status, stable for surgery.  I have reviewed the patient's chart and labs.  Questions were answered to the patient's satisfaction.     Hortencia Pilar

## 2022-06-06 NOTE — Op Note (Signed)
[facility]  Percutaneous Study/Intervention Procedural Note   Date of Surgery: 06/06/2022  Surgeon:Rajon Bisig, Dolores Lory   Pre-operative Diagnosis: Atherosclerotic occlusive disease bilateral lower extremities with lifestyle limiting claudication and rest pain symptoms left greater than right  Post-operative diagnosis:  Same  Procedure(s) Performed:  1.  Abdominal aortogram  2.  Bilateral distal runoff  3.  Percutaneous transluminal angioplasty and stent placement right common iliac artery; "kissing balloon" technique  4.  Percutaneous transluminal and plasty and stent placement left common iliac artery; "kissing balloon" technique  5.  Ultrasound guided access bilateral common femoral arteries  6.  StarClose closure device bilateral common femoral arteries  Anesthesia: Conscious sedation was administered under my direct supervision by the interventional radiology RN. IV Versed plus fentanyl were utilized. Continuous ECG, pulse oximetry and blood pressure was monitored throughout the entire procedure. Conscious sedation was for a total of 1 hour 2 minutes.  Sheath: Bilateral 23 cm 7 French Pinnacle sheaths retrograde common femoral arteries  Contrast: 80 cc  Fluoroscopy Time: 5.7 minutes  Indications: Patient presents with increasing pain of both lower extremities.  Physical examination as well as noninvasive studies demonstrate significant atherosclerotic occlusive disease.  Angiography with hope for intervention has been recommended.  Risks and benefits have been reviewed all questions have been answered alternative therapies have also been discussed patient has agreed to proceed.  Procedure:  Dana Mcclure a 76 y.o. female who was identified and appropriate procedural time out was performed.  The patient was then placed supine on the table and prepped and draped in the usual sterile fashion.  Ultrasound was used to evaluate the right common femoral artery.   It was echolucent and pulsatile indicating it is patent .  An ultrasound image was acquired for the permanent record.  A micropuncture needle was used to access the right common femoral artery under direct ultrasound guidance.  The microwire was then advanced under fluoroscopic guidance without difficulty followed by the micro-sheath  A 0.035 J wire was advanced without resistance and a 5Fr sheath was placed.    The pigtail catheter was then positioned at the level of T12 and an AP image of the aorta was obtained. After review the images the pigtail catheter was repositioned above the aortic bifurcation and bilateral oblique views of the pelvis were obtained. Subsequently the detector was returned to the AP position and bilateral lower extremity runoff was obtained.  After review the images the ultrasound was reprepped and delivered back onto the sterile field. The left common femoral was then imaged with the ultrasound it was noted to be echolucent and pulsatile indicating patency. Images recorded for the permanent record. Under real-time visualization a microneedle was inserted into the anterior wall the common femoral artery microwire was then advanced without difficulty under fluoroscopic guidance followed by placement of the micro-sheath.  An advantage wire was then negotiated under fluoroscopic guidance into the aorta.  7 French sheath was then placed.  6000 units of heparin was given and allowed to circulate for proximally 4 minutes.  The right sheath was then upsized to a [place] sheath as well after a advantage wire was advanced through the pigtail catheter. Magnified images of the aortic bifurcation were then made using hand injection contrast from the femoral sheaths.  A 6 mm x 60 mm Lutonix balloon was then advanced up the right side and a 6 mm x 80 mm Lutonix balloon was advanced up the left side.  Simultaneous inflations were  performed to 12 atm for 1 minute.  Follow-up imaging  demonstrated greater than 60% residual stenosis throughout both common iliac arteries and I elected to stent these lesions.  After appropriate sizing a 7 mm x 58 mm lifestream stent was selected for the right and a 7 mm x 58 mm lifestream stent was selected for the left. There were then advanced and positioned just above the aortic bifurcation. Insufflation for full expansion of the stents was performed simultaneously.  Magnified imaging of the left iliac bifurcation was then performed in the RAO projection.  A 7 mm x 36 mm lifestream stent was then advanced completing the intervention of the left common iliac artery.  Inflation was to 12 atm for 40 seconds.  Follow-up imaging was then performed and demonstrated wide patency of both common iliac arteries as well as the distal aorta.  The pigtail catheter was then introduced up the right and bolus injection of contrast was used to perform final imaging of the distal aortic reconstruction.  Oblique views were then obtained of the groins in succession and Star close device is deployed without difficulty bilaterally. There were no immediate complications   Findings:   Aortogram:  The abdominal aorta is opacified with a bolus injection contrast. Demonstrates diffuse disease but there are no hemodynamically significant lesions noted until the distal aortic bifurcation where calcific atherosclerotic changes increased dramatically at the distal aorta.  On the right there is a greater than 95% stenosis of the common iliac artery from its origin extending past the midportion of the artery.  On the left there is a flush occlusion of the common iliac artery which extends down to the iliac bifurcation.  Bilateral external iliac arteries are small but free of hemodynamically significant stenoses.  Bilateral internal iliac arteries demonstrate greater than 80% ostial stenosis but beyond this are patent.    Right Lower Extremity: The right common femoral artery  demonstrates mild to moderate atherosclerotic changes but there are no hemodynamically significant stenoses.  The profunda femoris is also patent.  The superficial femoral artery and popliteal arteries are mildly diseased without hemodynamically significant stenoses.  The trifurcation appears patent.  Left Lower Extremity:  The left common femoral artery demonstrates mild to moderate atherosclerotic changes but there are no hemodynamically significant stenoses.  The profunda femoris is also patent.  The superficial femoral artery and popliteal arteries are mildly diseased without hemodynamically significant stenoses.  The trifurcation appears patent.  Following placement of the iliac stents there is now wide patency with less than 10% residual stenosis with rapid flow through the aortic bifurcation and the common iliac arteries bilaterally.  Distal runoff is maintained  Summary:  Successful reconstruction of the distal aorta and bilateral iliac arteries  Disposition: Patient was taken to the recovery room in stable condition having tolerated the procedure well.  Belenda Cruise Schnier 06/06/2022,9:17 AM

## 2022-06-12 ENCOUNTER — Telehealth (INDEPENDENT_AMBULATORY_CARE_PROVIDER_SITE_OTHER): Payer: Self-pay

## 2022-06-12 ENCOUNTER — Encounter (INDEPENDENT_AMBULATORY_CARE_PROVIDER_SITE_OTHER): Payer: Self-pay | Admitting: Vascular Surgery

## 2022-06-12 ENCOUNTER — Telehealth (INDEPENDENT_AMBULATORY_CARE_PROVIDER_SITE_OTHER): Payer: Self-pay | Admitting: Vascular Surgery

## 2022-06-12 ENCOUNTER — Ambulatory Visit (INDEPENDENT_AMBULATORY_CARE_PROVIDER_SITE_OTHER): Payer: Medicare Other | Admitting: Vascular Surgery

## 2022-06-12 VITALS — BP 151/76 | HR 78 | Resp 19 | Ht 64.0 in | Wt 197.2 lb

## 2022-06-12 DIAGNOSIS — R21 Rash and other nonspecific skin eruption: Secondary | ICD-10-CM | POA: Insufficient documentation

## 2022-06-12 DIAGNOSIS — E782 Mixed hyperlipidemia: Secondary | ICD-10-CM | POA: Diagnosis not present

## 2022-06-12 DIAGNOSIS — I70213 Atherosclerosis of native arteries of extremities with intermittent claudication, bilateral legs: Secondary | ICD-10-CM

## 2022-06-12 DIAGNOSIS — I10 Essential (primary) hypertension: Secondary | ICD-10-CM | POA: Diagnosis not present

## 2022-06-12 MED ORDER — METHYLPREDNISOLONE 4 MG PO TBPK: ORAL_TABLET | ORAL | 1 refills | Status: DC

## 2022-06-12 NOTE — Telephone Encounter (Signed)
Rash developed in all folds of body not feeling well. This started on Friday and son stopped all of the medications that was prescribed after the procedure.  Her son states she needs to be seen today.  Please advise

## 2022-06-12 NOTE — Telephone Encounter (Signed)
Pt coming in today to see dr schnier

## 2022-06-12 NOTE — Progress Notes (Signed)
MRN : [id number]  Dana Mcclure is a 76 y.o. (August 31, 1946) female who presents with chief complaint of check circulation.  History of Present Illness:   The patient returns to the office sooner than expected for followup and review status post angiogram with intervention on 06/06/2022.   Procedure:  Percutaneous transluminal angioplasty and stent placement right common iliac artery; "kissing balloon" technique 2.    Percutaneous transluminal and plasty and stent placement left common iliac artery; "kissing balloon" technique  The patient notes improvement in the lower extremity symptoms. No interval shortening of the patient's claudication distance or rest pain symptoms. No new ulcers or wounds have occurred since the last visit.  The primary reason for today's visit is the patient developed a rash typical macular papular itchy diffuse rash 3 days after the procedure she first noticed it this past Friday.  No shortness of breath no swelling of her lips or tongue reported.  Current Meds  Medication Sig   acetaminophen (TYLENOL) 325 MG tablet Take 650 mg by mouth every 4 (four) hours as needed.   amLODipine (NORVASC) 10 MG tablet Take 10 mg by mouth daily.   aspirin EC 81 MG tablet 81 mg once daily.   hydrochlorothiazide (HYDRODIURIL) 12.5 MG tablet Take 12.5 mg by mouth daily.   naproxen sodium (ALEVE) 220 MG tablet Take 220 mg by mouth daily as needed.    Past Medical History:  Diagnosis Date   Arthritis    Cancer ([place])    Colon cancer ([place])    Hyperplastic colon polyp    Hypertension    Pneumothorax on right    PUD (peptic ulcer disease)    Tubular adenoma of colon     Past Surgical History:  Procedure Laterality Date   CHOLECYSTECTOMY     COLON SURGERY     COLONOSCOPY  2017   COLONOSCOPY WITH PROPOFOL N/A 07/09/2015   Procedure: COLONOSCOPY WITH PROPOFOL;  Surgeon: Lollie Sails, MD;  Location: [facility] ENDOSCOPY;  Service: Endoscopy;  Laterality:  N/A;   COLONOSCOPY WITH PROPOFOL N/A 04/29/2020   Procedure: COLONOSCOPY WITH PROPOFOL;  Surgeon: Toledo, Benay Pike, MD;  Location: [facility] ENDOSCOPY;  Service: Gastroenterology;  Laterality: N/A;   EXPLORATORY LAPAROTOMY     LOWER EXTREMITY ANGIOGRAPHY Left 06/06/2022   Procedure: Lower Extremity Angiography;  Surgeon: Katha Cabal, MD;  Location: [facility] [facility];  Service: Cardiovascular;  Laterality: Left;    Social History Social History   Tobacco Use   Smoking status: Every Day    Packs/day: 0.50    Types: Cigarettes    Last attempt to quit: 08/08/2016    Years since quitting: 5.8   Smokeless tobacco: Never  Substance Use Topics   Alcohol use: Not Currently   Drug use: Never    Family History Family History  Problem Relation Age of Onset   Colon polyps Daughter    Colon polyps Son    Breast cancer Neg Hx     No Known Allergies   REVIEW OF SYSTEMS (Negative unless checked)  Constitutional: '[]'$ Weight loss  '[]'$ Fever  '[]'$ Chills Cardiac: '[]'$ Chest pain   '[]'$ Chest pressure   '[]'$ Palpitations   '[]'$ Shortness of breath when laying flat   '[]'$ Shortness of breath with exertion. Vascular:  '[x]'$ Pain in legs with walking   '[]'$ Pain in legs at rest  '[]'$ History of DVT   '[]'$ Phlebitis   '[]'$ Swelling in legs   '[]'$ Varicose veins   '[]'$   Non-healing ulcers Pulmonary:   '[]'$ Uses home oxygen   '[]'$ Productive cough   '[]'$ Hemoptysis   '[]'$ Wheeze  '[]'$ COPD   '[]'$ Asthma Neurologic:  '[]'$ Dizziness   '[]'$ Seizures   '[]'$ History of stroke   '[]'$ History of TIA  '[]'$ Aphasia   '[]'$ Vissual changes   '[]'$ Weakness or numbness in arm   '[]'$ Weakness or numbness in leg Musculoskeletal:   '[]'$ Joint swelling   '[]'$ Joint pain   '[]'$ Low back pain Hematologic:  '[]'$ Easy bruising  '[]'$ Easy bleeding   '[]'$ Hypercoagulable state   '[]'$ Anemic Gastrointestinal:  '[]'$ Diarrhea   '[]'$ Vomiting  '[]'$ Gastroesophageal reflux/heartburn   '[]'$ Difficulty swallowing. Genitourinary:  '[]'$ Chronic kidney disease   '[]'$ Difficult urination  '[]'$ Frequent urination   '[]'$ Blood in urine Skin:  '[x]'$ Rashes    '[]'$ Ulcers  Psychological:  '[]'$ History of anxiety   '[]'$  History of major depression.  Physical Examination  Vitals:   06/12/22 1120  BP: (!) 151/76  Pulse: 78  Resp: 19  Weight: 197 lb 3.2 oz (89.4 kg)  Height: '5\' 4"'$  (1.626 m)   Body mass index is 33.85 kg/m. Gen: WD/WN, NAD Head: [place]/AT, No temporalis wasting.  Ear/Nose/Throat: Hearing grossly intact, nares w/o erythema or drainage Eyes: PER, EOMI, sclera nonicteric.  Neck: Supple, no masses.  No bruit or JVD.  Pulmonary:  Good air movement, no audible wheezing, no use of accessory muscles.  Cardiac: RRR, normal S1, S2, no Murmurs. Vascular:  mild trophic changes, no open wounds Vessel Right Left  Radial Palpable Palpable  PT Not Palpable Not Palpable  DP Not Palpable Not Palpable  Gastrointestinal: soft, non-distended. No guarding/no peritoneal signs.  Musculoskeletal: M/S 5/5 throughout.  No visible deformity.  Neurologic: CN 2-12 intact. Pain and light touch intact in extremities.  Symmetrical.  Speech is fluent. Motor exam as listed above. Psychiatric: Judgment intact, Mood & affect appropriate for pt's clinical situation. Dermatologic: Macular papular diffuse rash no ulcers noted.  No changes consistent with cellulitis.   CBC Lab Results  Component Value Date   WBC 9.7 08/28/2016   HGB 14.9 08/28/2016   HCT 42.5 08/28/2016   MCV 89.1 08/28/2016   PLT 308 08/28/2016    BMET    Component Value Date/Time   BUN 13 06/06/2022 0721   CREATININE 0.92 06/06/2022 0721   GFRNONAA >60 06/06/2022 0721   Estimated Creatinine Clearance: 56.3 mL/min (by C-G formula based on SCr of 0.92 mg/dL).  COAG No results found for: "INR", "PROTIME"  Radiology PERIPHERAL VASCULAR CATHETERIZATION  Result Date: 06/06/2022 See surgical note for result.  VAS US CAROTID  Result Date: 06/01/2022 Carotid Arterial Duplex Study Patient Name:  GISSELA BLOCH  Date of Exam:   05/29/2022 Medical Rec #: [id number]          Accession #:     [id number] Date of Birth: Sep 26, 1946          Patient Gender: F Patient Age:   19 years Exam Location:  [facility] Procedure:      VAS US CAROTID Referring Phys: Hortencia Pilar --------------------------------------------------------------------------------  Indications: Right bruit. Performing Technologist: Concha Norway RVT  Examination Guidelines: A complete evaluation includes B-mode imaging, spectral Doppler, color Doppler, and power Doppler as needed of all accessible portions of each vessel. Bilateral testing is considered an integral part of a complete examination. Limited examinations for reoccurring indications may be performed as noted.  Right Carotid Findings: +----------+--------+--------+--------+------------------+---------------------+           PSV cm/sEDV cm/sStenosisPlaque DescriptionComments              +----------+--------+--------+--------+------------------+---------------------+  CCA Prox  62      12                                                      +----------+--------+--------+--------+------------------+---------------------+ CCA Mid   60      12                                                      +----------+--------+--------+--------+------------------+---------------------+ CCA Distal52      12                                                      +----------+--------+--------+--------+------------------+---------------------+ ICA Prox  83      23                                                      +----------+--------+--------+--------+------------------+---------------------+ ICA Mid   79      7                                                       +----------+--------+--------+--------+------------------+---------------------+ ICA Distal55      12                                                      +----------+--------+--------+--------+------------------+---------------------+ ECA       273     8                                  no plaque seen,                                                           increased velocities  +----------+--------+--------+--------+------------------+---------------------+ +----------+--------+-------+----------------+-------------------+           PSV cm/sEDV cmsDescribe        Arm Pressure (mmHG) +----------+--------+-------+----------------+-------------------+ [id number]             Multiphasic, WNL                    +----------+--------+-------+----------------+-------------------+ +---------+--------+--+--------+-+---------+ VertebralPSV cm/s31EDV cm/s2Antegrade +---------+--------+--+--------+-+---------+  Left Carotid Findings: +----------+--------+--------+--------+------------------+--------+           PSV cm/sEDV cm/sStenosisPlaque DescriptionComments +----------+--------+--------+--------+------------------+--------+ CCA Prox  86      16                                         +----------+--------+--------+--------+------------------+--------+  CCA Mid   80      14                                         +----------+--------+--------+--------+------------------+--------+ CCA Distal89      11                                         +----------+--------+--------+--------+------------------+--------+ ICA Prox  98      25                                         +----------+--------+--------+--------+------------------+--------+ ICA Mid   76      13                                         +----------+--------+--------+--------+------------------+--------+ ICA Distal71      16                                         +----------+--------+--------+--------+------------------+--------+ ECA       114     9                                          +----------+--------+--------+--------+------------------+--------+ +----------+--------+--------+----------------+-------------------+           PSV cm/sEDV  cm/sDescribe        Arm Pressure (mmHG) +----------+--------+--------+----------------+-------------------+ [id number]              Multiphasic, WNL                    +----------+--------+--------+----------------+-------------------+ +---------+--------+--+--------+-+---------+ VertebralPSV cm/s33EDV cm/s6Antegrade +---------+--------+--+--------+-+---------+   Summary: Right Carotid: There was no evidence of thrombus, dissection, atherosclerotic                plaque or stenosis in the cervical carotid system. The ECA does                show elevated velocities with no stenosis or plaque seen. High                angle of bifurcation could be the reason. Left Carotid: The extracranial vessels were near-normal with only minimal wall               thickening or plaque. Vertebrals:  Bilateral vertebral arteries demonstrate antegrade flow. Subclavians: Normal flow hemodynamics were seen in bilateral subclavian              arteries. *See table(s) above for measurements and observations.  Electronically signed by Hortencia Pilar MD on 06/01/2022 at 4:20:29 PM.    Final    VAS [place] ABI WITH/WO TBI  Result Date: 06/01/2022  LOWER EXTREMITY DOPPLER STUDY Patient Name:  AYLANIE CUBILLOS  Date of Exam:   05/29/2022 Medical Rec #: [id number]          Accession #:    [id number] Date of Birth: 1945/11/06  Patient Gender: F Patient Age:   71 years [facility]am [facility] Procedure:      VAS [place] ABI WITH/WO TBI Referring Phys: Lulabelle Desta --------------------------------------------------------------------------------  Indications: Claudication, rest pain, and peripheral artery disease. High Risk Factors: Hypertension.  Performing Technologist: Concha Norway RVT  Examination Guidelines: A complete evaluation includes at minimum, Doppler waveform signals and systolic blood pressure reading at the level of bilateral brachial, anterior tibial, and posterior tibial arteries, when vessel  segments are accessible. Bilateral testing is considered an integral part of a complete examination. Photoelectric Plethysmograph (PPG) waveforms and toe systolic pressure readings are included as required and additional duplex testing as needed. Limited examinations for reoccurring indications may be performed as noted.  ABI Findings: +---------+------------------+-----+----------+--------+ Right    Rt Pressure (mmHg)IndexWaveform  Comment  +---------+------------------+-----+----------+--------+ Brachial 153                                       +---------+------------------+-----+----------+--------+ ATA      79                0.51 monophasic         +---------+------------------+-----+----------+--------+ PTA      96                0.62 biphasic           +---------+------------------+-----+----------+--------+ Great Toe80                0.52 Normal             +---------+------------------+-----+----------+--------+ +---------+------------------+-----+----------+-------+ Left     Lt Pressure (mmHg)IndexWaveform  Comment +---------+------------------+-----+----------+-------+ Brachial 154                                      +---------+------------------+-----+----------+-------+ ATA      66                0.43 monophasic        +---------+------------------+-----+----------+-------+ PTA      62                0.40 biphasic          +---------+------------------+-----+----------+-------+ Great Toe79                0.51 Abnormal          +---------+------------------+-----+----------+-------+ Added aorta distal region duplex shows low monophasic flow but no obvious evidence of AAA.  Summary: Right: Resting right ankle-brachial index indicates moderate right lower extremity arterial disease. The right toe-brachial index is abnormal. Added duplex images of the CFA show mild plaque and monophasic flow suggesting inflow disease. Left: Resting left  ankle-brachial index indicates severe left lower extremity arterial disease. The left toe-brachial index is abnormal. Added duplex images of the CFA show mild plaque and monophasic flow suggesting inflow disease. *See table(s) above for measurements and observations.  Electronically signed by Hortencia Pilar MD on 06/01/2022 at 4:16:45 PM.    Final      Assessment/Plan 1. Rash and nonspecific skin eruption This rash has the typical appearance of a contrast reaction.  The 3-day delay is also quite common.  I will treat with a Medrol Dosepak.  She can apply some Benadryl cream topically as well.  Alternatively to the topical agent she could take oral Benadryl if she is dealing with  severe itching.  We will amend her chart to reflect that from now on I would premedicate her before contrast exposure.  She will follow-up with me as scheduled post angiogram for her ABI and this will also serve as a follow-up to evaluate her rash.  2. Atherosclerosis of native artery of both lower extremities with intermittent claudication (HCC) Recommend:  The patient is status post successful angiogram with intervention.  The patient reports that the claudication symptoms and leg pain has improved.   The patient denies lifestyle limiting changes at this point in time.  No further invasive studies, angiography or surgery at this time The patient should continue walking and begin a more formal exercise program.  The patient should continue antiplatelet therapy and aggressive treatment of the lipid abnormalities  Continued surveillance is indicated as atherosclerosis is likely to progress with time.    Patient should undergo noninvasive studies as ordered. The patient will follow up with me to review the studies.    3. Hypertension, essential Continue antihypertensive medications as already ordered, these medications have been reviewed and there are no changes at this time.   4. Mixed hyperlipidemia Continue statin  as ordered and reviewed, no changes at this time     Hortencia Pilar, MD  06/12/2022 11:24 AM

## 2022-06-12 NOTE — Telephone Encounter (Signed)
Patient's son states pt. has rash under the creases of her arm, creases in back of her legs and under her breast. He states she has not been feeling very well and this weekend was rough on her. She also has been running a low grade fever.  Please call son at [phone].  Please advise.

## 2022-06-27 ENCOUNTER — Ambulatory Visit (INDEPENDENT_AMBULATORY_CARE_PROVIDER_SITE_OTHER): Payer: Medicare Other | Admitting: Nurse Practitioner

## 2022-06-27 ENCOUNTER — Encounter (INDEPENDENT_AMBULATORY_CARE_PROVIDER_SITE_OTHER): Payer: Self-pay | Admitting: Nurse Practitioner

## 2022-06-27 ENCOUNTER — Ambulatory Visit (INDEPENDENT_AMBULATORY_CARE_PROVIDER_SITE_OTHER): Payer: Medicare Other

## 2022-06-27 VITALS — BP 159/73 | HR 74 | Resp 18 | Ht 64.0 in | Wt 197.0 lb

## 2022-06-27 DIAGNOSIS — E782 Mixed hyperlipidemia: Secondary | ICD-10-CM

## 2022-06-27 DIAGNOSIS — I739 Peripheral vascular disease, unspecified: Secondary | ICD-10-CM

## 2022-06-27 DIAGNOSIS — I70213 Atherosclerosis of native arteries of extremities with intermittent claudication, bilateral legs: Secondary | ICD-10-CM

## 2022-06-27 DIAGNOSIS — M79604 Pain in right leg: Secondary | ICD-10-CM

## 2022-06-27 DIAGNOSIS — I10 Essential (primary) hypertension: Secondary | ICD-10-CM

## 2022-06-27 DIAGNOSIS — M79605 Pain in left leg: Secondary | ICD-10-CM

## 2022-06-27 NOTE — Progress Notes (Signed)
Subjective:    Patient ID: Alto Denver, female    DOB: 1946/09/03, 76 y.o.   MRN: [id number] No chief complaint on file.   The patient returns to the office for followup and review status post angiogram with intervention on 06/06/2022.   Procedure: Procedure(s) Performed:             1.  Abdominal aortogram             2.  Bilateral distal runoff             3.  Percutaneous transluminal angioplasty and stent placement right common iliac artery; "kissing balloon" technique             4.  Percutaneous transluminal and plasty and stent placement left common iliac artery; "kissing balloon" technique             5.  Ultrasound guided access bilateral common femoral arteries             6.  StarClose closure device bilateral common femoral arteries  The patient notes improvement in the lower extremity symptoms. No interval shortening of the patient's claudication distance or rest pain symptoms. No new ulcers or wounds have occurred since the last visit.  There have been no significant changes to the patient's overall health care.  No documented history of amaurosis fugax or recent TIA symptoms. There are no recent neurological changes noted. No documented history of DVT, PE or superficial thrombophlebitis. The patient denies recent episodes of angina or shortness of breath.   ABI's Rt=1.16 and Lt=1.13  (previous ABI's Rt=0.62 and Lt=0.43) Duplex US of the bilateral tibial arteries reveals triphasic waveforms with good toe waveforms bilaterally    Review of Systems  Cardiovascular:  Positive for leg swelling.  All other systems reviewed and are negative.      Objective:   Physical Exam Vitals reviewed.  HENT:     Head: Normocephalic.  Cardiovascular:     Rate and Rhythm: Normal rate.     Pulses:          Dorsalis pedis pulses are 2+ on the right side and 2+ on the left side.       Posterior tibial pulses are detected w/ Doppler on the right side and detected w/ Doppler  on the left side.  Pulmonary:     Effort: Pulmonary effort is normal.  Musculoskeletal:     Right lower leg: Edema present.     Left lower leg: Edema present.  Skin:    General: Skin is warm and dry.  Neurological:     Mental Status: She is alert and oriented to person, place, and time.  Psychiatric:        Mood and Affect: Mood normal.        Behavior: Behavior normal.        Thought Content: Thought content normal.        Judgment: Judgment normal.     BP (!) 159/73 (BP Location: Right Arm)   Pulse 74   Resp 18   Ht '5\' 4"'$  (1.626 m)   Wt 197 lb (89.4 kg)   BMI 33.81 kg/m   Past Medical History:  Diagnosis Date   Arthritis    Cancer ([place])    Colon cancer ([place])    Hyperplastic colon polyp    Hypertension    Pneumothorax on right    PUD (peptic ulcer disease)    Tubular adenoma of colon     Social  History   Socioeconomic History   Marital status: Married    Spouse name: Not on file   Number of children: Not on file   Years of education: Not on file   Highest education level: Not on file  Occupational History   Not on file  Tobacco Use   Smoking status: Every Day    Packs/day: 0.50    Types: Cigarettes    Last attempt to quit: 08/08/2016    Years since quitting: 5.8   Smokeless tobacco: Never  Substance and Sexual Activity   Alcohol use: Not Currently   Drug use: Never   Sexual activity: Not on file  Other Topics Concern   Not on file  Social History Narrative   Not on file   Social Determinants of Health   Financial Resource Strain: Not on file  Food Insecurity: Not on file  Transportation Needs: Not on file  Physical Activity: Not on file  Stress: Not on file  Social Connections: Not on file  Intimate Partner Violence: Not on file    Past Surgical History:  Procedure Laterality Date   CHOLECYSTECTOMY     COLON SURGERY     COLONOSCOPY  2017   COLONOSCOPY WITH PROPOFOL N/A 07/09/2015   Procedure: COLONOSCOPY WITH PROPOFOL;  Surgeon: Lollie Sails, MD;  [facility] ENDOSCOPY;  Service: Endoscopy;  Laterality: N/A;   COLONOSCOPY WITH PROPOFOL N/A 04/29/2020   Procedure: COLONOSCOPY WITH PROPOFOL;  Surgeon: Toledo, Benay Pike, MD;  Location: [facility] ENDOSCOPY;  Service: Gastroenterology;  Laterality: N/A;   EXPLORATORY LAPAROTOMY     LOWER EXTREMITY ANGIOGRAPHY Left 06/06/2022   Procedure: Lower Extremity Angiography;  Surgeon: Katha Cabal, MD;  Location: [place] [facility];  Service: Cardiovascular;  Laterality: Left;    Family History  Problem Relation Age of Onset   Colon polyps Daughter    Colon polyps Son    Breast cancer Neg Hx     Allergies  Allergen Reactions   Iodinated Contrast Media Rash       Latest Ref Rng & Units 08/28/2016    4:05 PM  CBC  WBC 3.6 - 11.0 K/uL 9.7   Hemoglobin 12.0 - 16.0 g/dL 14.9   Hematocrit 35.0 - 47.0 % 42.5   Platelets 150 - 440 K/uL 308       CMP     Component Value Date/Time   BUN 13 06/06/2022 0721   CREATININE 0.92 06/06/2022 0721   GFRNONAA >60 06/06/2022 0721     VAS [place] ABI WITH/WO TBI  Result Date: 06/01/2022  LOWER EXTREMITY DOPPLER STUDY Patient Name:  NABIHA PLANCK  Date of Exam:   05/29/2022 Medical Rec #: [id number]          Accession #:    [id number] Date of Birth: 06-23-1946          Patient Gender: F Patient Age:   3 years Exam Location:  [facility] Procedure:      VAS [place] ABI WITH/WO TBI Referring Phys: [facility] --------------------------------------------------------------------------------  Indications: Claudication, rest pain, and peripheral artery disease. High Risk Factors: Hypertension.  Performing Technologist: Concha Norway RVT  Examination Guidelines: A complete evaluation includes at minimum, Doppler waveform signals and systolic blood pressure reading at the level of bilateral brachial, anterior tibial, and posterior tibial arteries, when vessel segments are accessible. Bilateral testing is considered an integral part of  a complete examination. Photoelectric Plethysmograph (PPG) waveforms and toe systolic pressure readings are included as required  and additional duplex testing as needed. Limited examinations for reoccurring indications may be performed as noted.  ABI Findings: +---------+------------------+-----+----------+--------+ Right    Rt Pressure (mmHg)IndexWaveform  Comment  +---------+------------------+-----+----------+--------+ Brachial 153                                       +---------+------------------+-----+----------+--------+ ATA      79                0.51 monophasic         +---------+------------------+-----+----------+--------+ PTA      96                0.62 biphasic           +---------+------------------+-----+----------+--------+ Great Toe80                0.52 Normal             +---------+------------------+-----+----------+--------+ +---------+------------------+-----+----------+-------+ Left     Lt Pressure (mmHg)IndexWaveform  Comment +---------+------------------+-----+----------+-------+ Brachial 154                                      +---------+------------------+-----+----------+-------+ ATA      66                0.43 monophasic        +---------+------------------+-----+----------+-------+ PTA      62                0.40 biphasic          +---------+------------------+-----+----------+-------+ Great Toe79                0.51 Abnormal          +---------+------------------+-----+----------+-------+ Added aorta distal region duplex shows low monophasic flow but no obvious evidence of AAA.  Summary: Right: Resting right ankle-brachial index indicates moderate right lower extremity arterial disease. The right toe-brachial index is abnormal. Added duplex images of the CFA show mild plaque and monophasic flow suggesting inflow disease. Left: Resting left ankle-brachial index indicates severe left lower extremity arterial disease. The left  toe-brachial index is abnormal. Added duplex images of the CFA show mild plaque and monophasic flow suggesting inflow disease. *See table(s) above for measurements and observations.  Electronically signed by Hortencia Pilar MD on 06/01/2022 at 4:16:45 PM.    Final        Assessment & Plan:   1. Atherosclerosis of native artery of both lower extremities with intermittent claudication (HCC) Recommend:  The patient is status post successful angiogram with intervention.  The patient reports that the claudication symptoms and leg pain has improved.   The patient denies lifestyle limiting changes at this point in time.  No further invasive studies, angiography or surgery at this time The patient should continue walking and begin a more formal exercise program.  The patient should continue antiplatelet therapy and aggressive treatment of the lipid abnormalities  Continued surveillance is indicated as atherosclerosis is likely to progress with time.    Patient should undergo noninvasive studies as ordered. The patient will follow up with me to review the studies.    2. Mixed hyperlipidemia Continue statin as ordered and reviewed, no changes at this time   3. Hypertension, essential Continue antihypertensive medications as already ordered, these medications have been reviewed and there are no changes at this time.  Current Outpatient Medications on File Prior to Visit  Medication Sig Dispense Refill   acetaminophen (TYLENOL) 325 MG tablet Take 650 mg by mouth every 4 (four) hours as needed.     amLODipine (NORVASC) 10 MG tablet Take 10 mg by mouth daily.     aspirin EC 81 MG tablet 81 mg once daily.     clopidogrel (PLAVIX) 75 MG tablet Take 1 tablet (75 mg total) by mouth daily. 30 tablet 5   hydrochlorothiazide (HYDRODIURIL) 12.5 MG tablet Take 12.5 mg by mouth daily.     methylPREDNISolone (MEDROL DOSEPAK) 4 MG TBPK tablet Take as directed 21 tablet 1   naproxen sodium (ALEVE) 220 MG  tablet Take 220 mg by mouth daily as needed.     atorvastatin (LIPITOR) 10 MG tablet Take 1 tablet (10 mg total) by mouth daily. Further refills per patient's primary care provider (Patient not taking: Reported on 06/12/2022) 30 tablet 3   No current facility-administered medications on file prior to visit.    There are no Patient Instructions on file for this visit. No follow-ups on file.   Kris Hartmann, NP

## 2022-09-01 ENCOUNTER — Other Ambulatory Visit (INDEPENDENT_AMBULATORY_CARE_PROVIDER_SITE_OTHER): Payer: Self-pay | Admitting: Vascular Surgery

## 2022-09-26 ENCOUNTER — Other Ambulatory Visit (INDEPENDENT_AMBULATORY_CARE_PROVIDER_SITE_OTHER): Payer: Self-pay | Admitting: Nurse Practitioner

## 2022-09-26 DIAGNOSIS — I739 Peripheral vascular disease, unspecified: Secondary | ICD-10-CM

## 2022-09-27 DIAGNOSIS — I6529 Occlusion and stenosis of unspecified carotid artery: Secondary | ICD-10-CM | POA: Insufficient documentation

## 2022-09-27 NOTE — Progress Notes (Deleted)
MRN : [id number]  Dana Mcclure is a 76 y.o. (12-28-1945) female who presents with chief complaint of check circulation.  History of Present Illness:   The patient returns to the office sooner than expected for followup and review status post angiogram with intervention on 06/06/2022.    Procedure:  Percutaneous transluminal angioplasty and stent placement right common iliac artery; "kissing balloon" technique 2.    Percutaneous transluminal and plasty and stent placement left common iliac artery; "kissing balloon" technique   The patient notes improvement in the lower extremity symptoms. No interval shortening of the patient's claudication distance or rest pain symptoms. No new ulcers or wounds have occurred since the last visit.   ABI's Rt=*** and Lt=*** (previous ABI Rt=*** and Lt=***)  Previous Carotid duplex dated 05/29/2022  RICA 8-65% and LICA 7-84%   No outpatient medications have been marked as taking for the 09/28/22 encounter (Appointment) with Delana Meyer, Dolores Lory, MD.    Past Medical History:  Diagnosis Date   Arthritis    Cancer [facility])    Colon cancer ([place])    Hyperplastic colon polyp    Hypertension    Pneumothorax on right    PUD (peptic ulcer disease)    Tubular adenoma of colon     Past Surgical History:  Procedure Laterality Date   CHOLECYSTECTOMY     COLON SURGERY     COLONOSCOPY  2017   COLONOSCOPY WITH PROPOFOL N/A 07/09/2015   Procedure: COLONOSCOPY WITH PROPOFOL;  Surgeon: Lollie Sails, MD;  Location: [facility] ENDOSCOPY;  Service: Endoscopy;  Laterality: N/A;   COLONOSCOPY WITH PROPOFOL N/A 04/29/2020   Procedure: COLONOSCOPY WITH PROPOFOL;  Surgeon: Toledo, Benay Pike, MD;  Location: [facility] ENDOSCOPY;  Service: Gastroenterology;  Laterality: N/A;   EXPLORATORY LAPAROTOMY     LOWER EXTREMITY ANGIOGRAPHY Left 06/06/2022   Procedure: Lower Extremity Angiography;  Surgeon: Katha Cabal, MD;  Location: [place] [facility] LAB;  Service:  Cardiovascular;  Laterality: Left;    Social History Social History   Tobacco Use   Smoking status: Every Day    Packs/day: 0.50    Types: Cigarettes    Last attempt to quit: 08/08/2016    Years since quitting: 6.1   Smokeless tobacco: Never  Substance Use Topics   Alcohol use: Not Currently   Drug use: Never    Family History Family History  Problem Relation Age of Onset   Colon polyps Daughter    Colon polyps Son    Breast cancer Neg Hx     Allergies  Allergen Reactions   Iodinated Contrast Media Rash     REVIEW OF SYSTEMS (Negative unless checked)  Constitutional: '[]'$ Weight loss  '[]'$ Fever  '[]'$ Chills Cardiac: '[]'$ Chest pain   '[]'$ Chest pressure   '[]'$ Palpitations   '[]'$ Shortness of breath when laying flat   '[]'$ Shortness of breath with exertion. Vascular:  '[x]'$ Pain in legs with walking   '[]'$ Pain in legs at rest  '[]'$ History of DVT   '[]'$ Phlebitis   '[]'$ Swelling in legs   '[]'$ Varicose veins   '[]'$ Non-healing ulcers Pulmonary:   '[]'$ Uses home oxygen   '[]'$ Productive cough   '[]'$ Hemoptysis   '[]'$ Wheeze  '[]'$ COPD   '[]'$ Asthma Neurologic:  '[]'$ Dizziness   '[]'$ Seizures   '[]'$ History of stroke   '[]'$ History of TIA  '[]'$ Aphasia   '[]'$ Vissual changes   '[]'$ Weakness or numbness in arm   '[]'$ Weakness or numbness in leg Musculoskeletal:   '[]'$ Joint swelling   '[]'$   Joint pain   '[]'$ Low back pain Hematologic:  '[]'$ Easy bruising  '[]'$ Easy bleeding   '[]'$ Hypercoagulable state   '[]'$ Anemic Gastrointestinal:  '[]'$ Diarrhea   '[]'$ Vomiting  '[]'$ Gastroesophageal reflux/heartburn   '[]'$ Difficulty swallowing. Genitourinary:  '[]'$ Chronic kidney disease   '[]'$ Difficult urination  '[]'$ Frequent urination   '[]'$ Blood in urine Skin:  '[]'$ Rashes   '[]'$ Ulcers  Psychological:  '[]'$ History of anxiety   '[]'$  History of major depression.  Physical Examination  There were no vitals filed for this visit. There is no height or weight on file to calculate BMI. Gen: WD/WN, NAD Head: [place]/AT, No temporalis wasting.  Ear/Nose/Throat: Hearing grossly intact, nares w/o erythema or drainage Eyes:  PER, EOMI, sclera nonicteric.  Neck: Supple, no masses.  No bruit or JVD.  Pulmonary:  Good air movement, no audible wheezing, no use of accessory muscles.  Cardiac: RRR, normal S1, S2, no Murmurs. Vascular:  mild trophic changes, no open wounds Vessel Right Left  Radial Palpable Palpable  PT Not Palpable Not Palpable  DP Not Palpable Not Palpable  Gastrointestinal: soft, non-distended. No guarding/no peritoneal signs.  Musculoskeletal: M/S 5/5 throughout.  No visible deformity.  Neurologic: CN 2-12 intact. Pain and light touch intact in extremities.  Symmetrical.  Speech is fluent. Motor exam as listed above. Psychiatric: Judgment intact, Mood & affect appropriate for pt's clinical situation. Dermatologic: No rashes or ulcers noted.  No changes consistent with cellulitis.   CBC Lab Results  Component Value Date   WBC 9.7 08/28/2016   HGB 14.9 08/28/2016   HCT 42.5 08/28/2016   MCV 89.1 08/28/2016   PLT 308 08/28/2016    BMET    Component Value Date/Time   BUN 13 06/06/2022 0721   CREATININE 0.92 06/06/2022 0721   GFRNONAA >60 06/06/2022 0721   CrCl cannot be calculated (Patient's most recent lab result is older than the maximum 21 days allowed.).  COAG No results found for: "INR", "PROTIME"  Radiology No results found.   Assessment/Plan There are no diagnoses linked to this encounter.   Hortencia Pilar, MD  09/27/2022 9:00 AM

## 2022-09-28 ENCOUNTER — Encounter (INDEPENDENT_AMBULATORY_CARE_PROVIDER_SITE_OTHER): Payer: Medicare Other

## 2022-09-28 ENCOUNTER — Ambulatory Visit (INDEPENDENT_AMBULATORY_CARE_PROVIDER_SITE_OTHER): Payer: Medicare Other | Admitting: Vascular Surgery

## 2022-09-28 DIAGNOSIS — I6523 Occlusion and stenosis of bilateral carotid arteries: Secondary | ICD-10-CM

## 2022-09-28 DIAGNOSIS — I70213 Atherosclerosis of native arteries of extremities with intermittent claudication, bilateral legs: Secondary | ICD-10-CM

## 2022-09-28 DIAGNOSIS — M79604 Pain in right leg: Secondary | ICD-10-CM

## 2022-09-28 DIAGNOSIS — E782 Mixed hyperlipidemia: Secondary | ICD-10-CM

## 2022-09-28 DIAGNOSIS — I10 Essential (primary) hypertension: Secondary | ICD-10-CM

## 2022-10-02 ENCOUNTER — Other Ambulatory Visit (INDEPENDENT_AMBULATORY_CARE_PROVIDER_SITE_OTHER): Payer: Self-pay | Admitting: Vascular Surgery

## 2022-10-26 ENCOUNTER — Other Ambulatory Visit (INDEPENDENT_AMBULATORY_CARE_PROVIDER_SITE_OTHER): Payer: Self-pay | Admitting: Vascular Surgery

## 2022-10-27 NOTE — Telephone Encounter (Signed)
Patient should contact PCP for further refills

## 2023-01-01 ENCOUNTER — Other Ambulatory Visit (INDEPENDENT_AMBULATORY_CARE_PROVIDER_SITE_OTHER): Payer: Self-pay | Admitting: Vascular Surgery

## 2023-01-01 NOTE — Telephone Encounter (Signed)
Patient needs to contact PCP for refill

## 2023-03-14 ENCOUNTER — Other Ambulatory Visit: Payer: Self-pay

## 2023-03-14 DIAGNOSIS — Z1231 Encounter for screening mammogram for malignant neoplasm of breast: Secondary | ICD-10-CM

## 2023-03-28 ENCOUNTER — Other Ambulatory Visit (INDEPENDENT_AMBULATORY_CARE_PROVIDER_SITE_OTHER): Payer: Self-pay | Admitting: Vascular Surgery

## 2023-06-11 NOTE — Anesthesia Preprocedure Evaluation (Addendum)
Anesthesia Evaluation  Patient identified by MRN, date of birth, ID band Patient awake    Reviewed: Allergy & Precautions, NPO status , Patient's Chart, lab work & pertinent test results  History of Anesthesia Complications Negative for: history of anesthetic complications  Airway Mallampati: III  TM Distance: >3 FB Neck ROM: full    Dental  (+) Lower Dentures, Upper Dentures   Pulmonary neg pulmonary ROS   Pulmonary exam normal        Cardiovascular hypertension, On Medications + Peripheral Vascular Disease (on plavix)  Normal cardiovascular exam     Neuro/Psych TIA negative psych ROS   GI/Hepatic Neg liver ROS, PUD,,,  Endo/Other  negative endocrine ROS    Renal/GU      Musculoskeletal   Abdominal   Peds  Hematology negative hematology ROS (+)   Anesthesia Other Findings Past Medical History: No date: Arthritis No date: Atherosclerosis of native artery of both lower extremities  with intermittent claudication (HCC) No date: Cancer (HCC) No date: Colon cancer (HCC) No date: Hyperplastic colon polyp No date: Hypertension No date: Pneumothorax on right No date: PUD (peptic ulcer disease) 05/02/2016: TIA (transient ischemic attack)     Comment:  no deficits No date: Tubular adenoma of colon No date: Wears dentures     Comment:  partial upper and lower No date: 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  Past Surgical History: No date: CHOLECYSTECTOMY No date: COLON SURGERY 2017: COLONOSCOPY 07/09/2015: COLONOSCOPY WITH PROPOFOL; N/A     Comment:  Procedure: COLONOSCOPY WITH PROPOFOL;  Surgeon: Christena Deem, MD;  Location: [facility] ENDOSCOPY;  Service:               Endoscopy;  Laterality: N/A; 04/29/2020: COLONOSCOPY WITH PROPOFOL; N/A     Comment:  Procedure: COLONOSCOPY WITH PROPOFOL;  Surgeon: Toledo,               Boykin Nearing, MD;  Location: [facility] ENDOSCOPY;  Service:               Gastroenterology;   Laterality: N/A; No date: EXPLORATORY LAPAROTOMY 06/06/2022: LOWER EXTREMITY ANGIOGRAPHY; Left     Comment:  Procedure: Lower Extremity Angiography;  Surgeon:               Renford Dills, MD;  Location: [facility] INVASIVE CV LAB;               Service: Cardiovascular;  Laterality: Left;  BMI    Body Mass Index: 32.60 kg/m      Reproductive/Obstetrics negative OB ROS                             Anesthesia Physical Anesthesia Plan  ASA: 3  Anesthesia Plan: General ETT   Post-op Pain Management: Ofirmev IV (intra-op) and Toradol IV (intra-op)   Induction: Intravenous  PONV Risk Score and Plan: 2 and Ondansetron, Dexamethasone and Treatment may vary due to age or medical condition  Airway Management Planned: Oral ETT  Additional Equipment:   Intra-op Plan:   Post-operative Plan: Extubation in OR  Informed Consent: I have reviewed the patients History and Physical, chart, labs and discussed the procedure including the risks, benefits and alternatives for the proposed anesthesia with the patient or authorized representative who has indicated his/her understanding and acceptance.     Dental Advisory Given  Plan Discussed with: Anesthesiologist, CRNA and  Surgeon  Anesthesia Plan Comments: (Patient consented for risks of anesthesia including but not limited to:  - adverse reactions to medications - damage to eyes, teeth, lips or other oral mucosa - nerve damage due to positioning  - sore throat or hoarseness - Damage to heart, brain, nerves, lungs, other parts of body or loss of life  Patient voiced understanding.)       Anesthesia Quick Evaluation

## 2023-06-13 ENCOUNTER — Encounter: Payer: Self-pay | Admitting: Unknown Physician Specialty

## 2023-06-29 ENCOUNTER — Other Ambulatory Visit: Payer: Self-pay

## 2023-06-29 ENCOUNTER — Ambulatory Visit: Payer: Medicare Other | Admitting: Anesthesiology

## 2023-06-29 ENCOUNTER — Encounter
Admission: RE | Disposition: A | Discharge status: Home / Self Care | Payer: Self-pay | Source: Home / Self Care | Attending: Unknown Physician Specialty

## 2023-06-29 ENCOUNTER — Encounter: Payer: Self-pay | Admitting: Unknown Physician Specialty

## 2023-06-29 ENCOUNTER — Ambulatory Visit
Admission: RE | Admit: 2023-06-29 | Discharge: 2023-06-29 | Disposition: A | Discharge status: Home / Self Care | Payer: Medicare Other | Attending: Unknown Physician Specialty | Admitting: Unknown Physician Specialty

## 2023-06-29 DIAGNOSIS — Z720 Tobacco use: Secondary | ICD-10-CM | POA: Diagnosis not present

## 2023-06-29 DIAGNOSIS — D380 Neoplasm of uncertain behavior of larynx: Secondary | ICD-10-CM | POA: Diagnosis present

## 2023-06-29 DIAGNOSIS — I10 Essential (primary) hypertension: Secondary | ICD-10-CM | POA: Insufficient documentation

## 2023-06-29 DIAGNOSIS — J381 Polyp of vocal cord and larynx: Secondary | ICD-10-CM | POA: Insufficient documentation

## 2023-06-29 DIAGNOSIS — Z8711 Personal history of peptic ulcer disease: Secondary | ICD-10-CM | POA: Insufficient documentation

## 2023-06-29 DIAGNOSIS — I739 Peripheral vascular disease, unspecified: Secondary | ICD-10-CM | POA: Diagnosis not present

## 2023-06-29 HISTORY — DX: Presence of dental prosthetic device (complete) (partial): Z97.2

## 2023-06-29 HISTORY — PX: MICROLARYNGOSCOPY: SHX5208

## 2023-06-29 HISTORY — DX: Atherosclerosis of native arteries of extremities with intermittent claudication, bilateral legs: I70.213

## 2023-06-29 HISTORY — DX: Presence of external hearing-aid: Z97.4

## 2023-06-29 SURGERY — MICROLARYNGOSCOPY
Anesthesia: General | Site: Throat | Laterality: Bilateral

## 2023-06-29 MED ORDER — SUCCINYLCHOLINE CHLORIDE 200 MG/10ML IV SOSY
PREFILLED_SYRINGE | INTRAVENOUS | Status: DC | PRN
  Administered 2023-06-29: 100 mg via INTRAVENOUS

## 2023-06-29 MED ORDER — DEXAMETHASONE SODIUM PHOSPHATE 4 MG/ML IJ SOLN
INTRAMUSCULAR | Status: DC | PRN
  Administered 2023-06-29: 8 mg via INTRAVENOUS

## 2023-06-29 MED ORDER — LACTATED RINGERS IV SOLN: INTRAVENOUS | Status: DC | PRN

## 2023-06-29 MED ORDER — ONDANSETRON HCL 4 MG/2ML IJ SOLN
INTRAMUSCULAR | Status: DC | PRN
  Administered 2023-06-29: 4 mg via INTRAVENOUS

## 2023-06-29 MED ORDER — PHENYLEPHRINE HCL 0.5 % NA SOLN
NASAL | Status: DC | PRN
  Administered 2023-06-29: 15 mL via TOPICAL

## 2023-06-29 MED ORDER — PROPOFOL 10 MG/ML IV BOLUS
INTRAVENOUS | Status: DC | PRN
  Administered 2023-06-29: 150 mg via INTRAVENOUS

## 2023-06-29 MED ORDER — FENTANYL CITRATE (PF) 100 MCG/2ML IJ SOLN
INTRAMUSCULAR | Status: DC | PRN
  Administered 2023-06-29 (×2): 50 ug via INTRAVENOUS

## 2023-06-29 MED ORDER — LIDOCAINE HCL (CARDIAC) PF 100 MG/5ML IV SOSY
PREFILLED_SYRINGE | INTRAVENOUS | Status: DC | PRN
  Administered 2023-06-29: 100 mg via INTRAVENOUS

## 2023-06-29 MED ORDER — MIDAZOLAM HCL 5 MG/5ML IJ SOLN
INTRAMUSCULAR | Status: DC | PRN
  Administered 2023-06-29: 2 mg via INTRAVENOUS

## 2023-06-29 SURGICAL SUPPLY — 21 items
ANTIFOG SOL W/FOAM PAD STRL (MISCELLANEOUS) ×1
BASIN GRAD PLASTIC 32OZ STRL (MISCELLANEOUS) ×1 IMPLANT
BLOCK BITE GUARD (MISCELLANEOUS) ×1 IMPLANT
CONT SPEC 4OZ CLIKSEAL STRL BL (MISCELLANEOUS) ×1 IMPLANT
COVER MAYO STAND STRL (DRAPES) ×1 IMPLANT
COVER TABLE BACK 60X90 (DRAPES) ×1 IMPLANT
CUP MEDICINE 2OZ PLAST GRAD ST (MISCELLANEOUS) ×1 IMPLANT
DRAPE SHEET LG 3/4 BI-LAMINATE (DRAPES) ×1 IMPLANT
DRSG TELFA 4X3 1S NADH ST (GAUZE/BANDAGES/DRESSINGS) ×1 IMPLANT
GLOVE SURG ENC TEXT LTX SZ7.5 (GLOVE) ×1 IMPLANT
KIT TURNOVER KIT A (KITS) ×1 IMPLANT
MARKER SKIN DUAL TIP RULER LAB (MISCELLANEOUS) ×1 IMPLANT
NDL FILTER BLUNT 18X1 1/2 (NEEDLE) ×1 IMPLANT
NEEDLE FILTER BLUNT 18X1 1/2 (NEEDLE) ×1 IMPLANT
NS IRRIG 500ML POUR BTL (IV SOLUTION) ×1 IMPLANT
PATTIES SURGICAL .5 X.5 (GAUZE/BANDAGES/DRESSINGS) ×1 IMPLANT
SOLUTION ANTFG W/FOAM PAD STRL (MISCELLANEOUS) ×1 IMPLANT
STRAP BODY AND KNEE 60X3 (MISCELLANEOUS) ×1 IMPLANT
SYR 10ML LL (SYRINGE) ×1 IMPLANT
TOWEL OR 17X26 4PK STRL BLUE (TOWEL DISPOSABLE) ×1 IMPLANT
TUBING SUCTION CONN 0.25 STRL (TUBING) ×1 IMPLANT

## 2023-06-29 NOTE — Anesthesia Postprocedure Evaluation (Signed)
Anesthesia Post Note  Patient: Dana Mcclure  Procedure(s) Performed: Duncan Dull LARYNGOSCOPY WITH BIOSPY (Bilateral: Throat)  Patient location during evaluation: PACU Anesthesia Type: General Level of consciousness: awake and alert Pain management: pain level controlled Vital Signs Assessment: post-procedure vital signs reviewed and stable Respiratory status: spontaneous breathing, nonlabored ventilation, respiratory function stable and patient connected to nasal cannula oxygen Cardiovascular status: blood pressure returned to baseline and stable Postop Assessment: no apparent nausea or vomiting Anesthetic complications: no   No notable events documented.   Last Vitals:  Vitals:   06/29/23 0950 06/29/23 1030  BP: (!) 143/65 125/70  Pulse: 69 68  Resp: 17 (!) 21  Temp: (!) 36.3 C   SpO2: 94% 96%    Last Pain:  Vitals:   06/29/23 1030  TempSrc:   PainSc: 0-No pain                 Louie Boston

## 2023-06-29 NOTE — H&P (Signed)
The patient's history has been reviewed, patient examined, no change in status, stable for surgery.  Questions were answered to the patients satisfaction.  

## 2023-06-29 NOTE — Op Note (Signed)
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  After general endotracheal anesthesia the tube was turned 90 degrees.  A tooth guard was then placed.  A Dedo laryngoscope was introduced into the airway after the patient had been draped in usual fashion.  With the laryngoscope in position and suspended examination of the larynx with a Hopkins rod showed polypoid disease of each vocal fold.  It appeared to be fairly symmetric.  Photodocumentation was performed.  The operating microscope was then brought into the field and examination larynx confirmed the polypoid disease bilaterally.  Cottonoid pledgets with phenylephrine lidocaine solution was used to bathe the larynx.  After proximately 5 minutes these pledgets were removed.  Beginning on the right-hand side the [facility] microlaryngeal instruments were used.  The cup forceps were used to grasp the polyp and gently medialized this.  The 45 degree scissors were then used to excise the polyp.  Care was taken to avoid injury to the vocal ligament.  With the right polyp removed in similar fashion the polypoid tissue was grasped on the left which gently medialized and removed using the straight and 45 degree shaft trace scissors.  Again care was taken to avoid injury to the vocal ligament.  Both of the polypoid masses were both sent for pathology.  The larynx was then again bathed with the phenylephrine lidocaine solution.  At approximate 5 minutes this was removed.  Reexamination larynx showed no significant residual polypoid disease.  The suspension apparatus was removed the  endotracheal tube was moved forward and the posterior glottis was examined.  There did not appear to be any other suspicious lesions.  With the procedure completed the patient was then returned to anesthesia where she was awakened in the operating type cart in stable condition.  Specimen: Bilateral lower cord polyps  Dispo:   Good  Plan: Charged home on voice rest will follow-up in 2 to 3 weeks.  Davina Poke  06/29/2023 9:34 AM

## 2023-06-29 NOTE — Anesthesia Procedure Notes (Signed)
Procedure Name: Intubation Date/Time: 06/29/2023 9:05 AM  Performed by: Domenic Moras, CRNAPre-anesthesia Checklist: Patient identified, Emergency Drugs available, Suction available and Patient being monitored Patient Re-evaluated:Patient Re-evaluated prior to induction Oxygen Delivery Method: Circle system utilized Preoxygenation: Pre-oxygenation with 100% oxygen Induction Type: IV induction Ventilation: Mask ventilation without difficulty Laryngoscope Size: McGraph and 3 Tube type: Oral Tube size: 6.0 mm Number of attempts: 2 Airway Equipment and Method: Stylet and Oral airway Placement Confirmation: ETT inserted through vocal cords under direct vision, positive ETCO2 and breath sounds checked- equal and bilateral Secured at: 21 cm Tube secured with: Tape Dental Injury: Teeth and Oropharynx as per pre-operative assessment  Comments: First attempt DL unsuccessful

## 2023-06-29 NOTE — Transfer of Care (Signed)
Immediate Anesthesia Transfer of Care Note  Patient: Dana Mcclure  Procedure(s) Performed: Duncan Dull LARYNGOSCOPY WITH BIOSPY (Bilateral: Throat)  Patient Location: PACU  Anesthesia Type: General ETT  Level of Consciousness: awake, alert  and patient cooperative  Airway and Oxygen Therapy: Patient Spontanous Breathing and Patient connected to supplemental oxygen  Post-op Assessment: Post-op Vital signs reviewed, Patient's Cardiovascular Status Stable, Respiratory Function Stable, Patent Airway and No signs of Nausea or vomiting  Post-op Vital Signs: Reviewed and stable  Complications: No notable events documented.

## 2023-06-30 ENCOUNTER — Encounter: Payer: Self-pay | Admitting: Unknown Physician Specialty

## 2023-07-31 ENCOUNTER — Telehealth (INDEPENDENT_AMBULATORY_CARE_PROVIDER_SITE_OTHER): Payer: Self-pay

## 2023-07-31 NOTE — Telephone Encounter (Signed)
Pt's daughter in law called concerned about pt being SOB and having difficulty walking.  She states pt could not walk across the parking lot at the restaurant last night.  This is the way the pt was before she had stents placed.  They would like pt to come in to be evaluated.  Please advise

## 2023-07-31 NOTE — Telephone Encounter (Signed)
We have not seen her in this office in a year.  She needs to see her PCP and possibly the emergency room to evaluate for why she is having SOB.  Her stents would not improve her breathing and that is a larger concern in this case.  We can see her for ABIs but someone needs to evaluate why she is short of breath (which we wouldn't do as vascular)

## 2023-09-03 ENCOUNTER — Encounter (INDEPENDENT_AMBULATORY_CARE_PROVIDER_SITE_OTHER): Payer: Self-pay | Admitting: Vascular Surgery

## 2023-09-03 ENCOUNTER — Ambulatory Visit (INDEPENDENT_AMBULATORY_CARE_PROVIDER_SITE_OTHER): Payer: Medicare Other

## 2023-09-03 ENCOUNTER — Ambulatory Visit (INDEPENDENT_AMBULATORY_CARE_PROVIDER_SITE_OTHER): Payer: Medicare Other | Admitting: Vascular Surgery

## 2023-09-03 VITALS — BP 123/70 | HR 18 | Resp 18 | Ht 64.0 in | Wt 215.0 lb

## 2023-09-03 DIAGNOSIS — I739 Peripheral vascular disease, unspecified: Secondary | ICD-10-CM | POA: Diagnosis not present

## 2023-09-03 DIAGNOSIS — I890 Lymphedema, not elsewhere classified: Secondary | ICD-10-CM | POA: Insufficient documentation

## 2023-09-03 DIAGNOSIS — Z9889 Other specified postprocedural states: Secondary | ICD-10-CM | POA: Diagnosis not present

## 2023-09-03 DIAGNOSIS — I10 Essential (primary) hypertension: Secondary | ICD-10-CM

## 2023-09-03 DIAGNOSIS — M79605 Pain in left leg: Secondary | ICD-10-CM

## 2023-09-03 DIAGNOSIS — M79604 Pain in right leg: Secondary | ICD-10-CM | POA: Diagnosis not present

## 2023-09-03 DIAGNOSIS — I6523 Occlusion and stenosis of bilateral carotid arteries: Secondary | ICD-10-CM

## 2023-09-03 NOTE — Progress Notes (Signed)
MRN : [id number]  Dana Mcclure is a 77 y.o. ([birth year]) female who presents with chief complaint of legs swell.  History of Present Illness:   The patient returns to the office for followup evaluation regarding leg swelling.  The swelling has persisted and the pain associated with swelling continues. There have not been any interval development of a ulcerations or wounds.  Since the previous visit the patient has been wearing graduated compression stockings and has noted little if any improvement in the lymphedema. The patient has been using compression routinely morning until night.  Additionally, the patient notes that she has recently had several of her medicines changed and her diuretic therapy altered.  This was in part due to increased swelling of her lower extremities but also in part due to her congestive heart failure and cardiac issues.  She does note that since these changes she has had some improvement in her lower extremity edema  The patient also states elevation during the day and exercise is being done too.  ABI's done today show Rt=0.99 and Lt=0.90 TBI's are normal bilaterally  (previous ABI's Rt=1.16 and Lt=1.13)  Current Meds  Medication Sig   acetaminophen (TYLENOL) 325 MG tablet Take 650 mg by mouth every 4 (four) hours as needed.   amLODipine (NORVASC) 10 MG tablet Take 10 mg by mouth daily.   aspirin EC 81 MG tablet 81 mg once daily.   clopidogrel (PLAVIX) 75 MG tablet TAKE 1 TABLET BY MOUTH EVERY DAY   empagliflozin (JARDIANCE) 10 MG TABS tablet Take 1 tablet by mouth daily.   furosemide (LASIX) 20 MG tablet Take 1 tablet by mouth daily.   hydrochlorothiazide (HYDRODIURIL) 12.5 MG tablet Take 12.5 mg by mouth daily.   losartan (COZAAR) 50 MG tablet Take 1 tablet by mouth daily.   naproxen sodium (ALEVE) 220 MG tablet Take 220 mg by mouth daily as needed.    Past Medical History:  Diagnosis Date   Arthritis     Atherosclerosis of native artery of both lower extremities with intermittent claudication (HCC)    Cancer (HCC)    Colon cancer (HCC)    Hyperplastic colon polyp    Hypertension    Pneumothorax on right    PUD (peptic ulcer disease)    TIA (transient ischemic attack) 05/02/2016   no deficits   Tubular adenoma of colon    Wears dentures    partial upper and lower   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     Past Surgical History:  Procedure Laterality Date   CHOLECYSTECTOMY     COLON SURGERY     COLONOSCOPY  2017   COLONOSCOPY WITH PROPOFOL N/A 07/09/2015   Procedure: COLONOSCOPY WITH PROPOFOL;  Surgeon: Christena Deem, MD;  Location: [facility] ENDOSCOPY;  Service: Endoscopy;  Laterality: N/A;   COLONOSCOPY WITH PROPOFOL N/A 04/29/2020   Procedure: COLONOSCOPY WITH PROPOFOL;  Surgeon: Toledo, Boykin Nearing, MD;  Location: [facility] ENDOSCOPY;  Service: Gastroenterology;  Laterality: N/A;   EXPLORATORY LAPAROTOMY     LOWER EXTREMITY ANGIOGRAPHY Left 06/06/2022   Procedure: Lower Extremity Angiography;  Surgeon: Renford Dills, MD;  Location: [facility] INVASIVE CV LAB;  Service: Cardiovascular;  Laterality: Left;   MICROLARYNGOSCOPY Bilateral 06/29/2023   Procedure: MICRO-DIRECT LARYNGOSCOPY WITH BIOSPY;  Surgeon: Linus Salmons, MD;  Location: [facility];  Service:  ENT;  Laterality: Bilateral;    Social History Social History   Tobacco Use   Smoking status: Former    Current packs/day: 0.00    Average packs/day: 0.5 packs/day for 40.6 years (20.3 ttl pk-yrs)    Types: Cigarettes    Start date: 10/23/1982    Quit date: 05/13/2023    Years since quitting: 0.3   Smokeless tobacco: Never  Vaping Use   Vaping status: Never Used  Substance Use Topics   Alcohol use: Not Currently   Drug use: Never    Family History Family History  Problem Relation Age of Onset   Colon polyps Daughter    Colon polyps Son    Breast cancer Neg Hx     Allergies  Allergen Reactions   Iodinated Contrast  Media Rash     REVIEW OF SYSTEMS (Negative unless checked)  Constitutional: [] Weight loss  [] Fever  [] Chills Cardiac: [] Chest pain   [] Chest pressure   [] Palpitations   [] Shortness of breath when laying flat   [] Shortness of breath with exertion. Vascular:  [] Pain in legs with walking   [x] Pain in legs with standing  [] History of DVT   [] Phlebitis   [x] Swelling in legs   [] Varicose veins   [] Non-healing ulcers Pulmonary:   [] Uses home oxygen   [] Productive cough   [] Hemoptysis   [] Wheeze  [] COPD   [] Asthma Neurologic:  [] Dizziness   [] Seizures   [] History of stroke   [] History of TIA  [] Aphasia   [] Vissual changes   [] Weakness or numbness in arm   [] Weakness or numbness in leg Musculoskeletal:   [] Joint swelling   [] Joint pain   [] Low back pain Hematologic:  [] Easy bruising  [] Easy bleeding   [] Hypercoagulable state   [] Anemic Gastrointestinal:  [] Diarrhea   [] Vomiting  [] Gastroesophageal reflux/heartburn   [] Difficulty swallowing. Genitourinary:  [] Chronic kidney disease   [] Difficult urination  [] Frequent urination   [] Blood in urine Skin:  [] Rashes   [] Ulcers  Psychological:  [] History of anxiety   []  History of major depression.  Physical Examination  Vitals:   09/03/23 1134  BP: 123/70  Pulse: (!) 18  Resp: 18  Weight: 215 lb (97.5 kg)  Height: 5\' 4"  (1.626 m)   Body mass index is 36.9 kg/m. Gen: WD/WN, NAD Head: [place]/AT, No temporalis wasting.  Ear/Nose/Throat: Hearing grossly intact, nares w/o erythema or drainage, pinna without lesions Eyes: PER, EOMI, sclera nonicteric.  Neck: Supple, no gross masses.  No JVD.  Pulmonary:  Good air movement, no audible wheezing, no use of accessory muscles.  Cardiac: RRR, precordium not hyperdynamic. Vascular:  scattered varicosities present bilaterally.  Mild venous stasis changes to the legs bilaterally.  3+ soft pitting edema, CEAP C4sEpAsPr  Vessel Right Left  Radial Palpable Palpable  Gastrointestinal: soft, non-distended. No  guarding/no peritoneal signs.  Musculoskeletal: M/S 5/5 throughout.  No deformity.  Neurologic: CN 2-12 intact. Pain and light touch intact in extremities.  Symmetrical.  Speech is fluent. Motor exam as listed above. Psychiatric: Judgment intact, Mood & affect appropriate for pt's clinical situation. Dermatologic: Venous rashes no ulcers noted.  No changes consistent with cellulitis. Lymph : No lichenification or skin changes of chronic lymphedema.  CBC Lab Results  Component Value Date   WBC 9.7 08/28/2016   HGB 14.9 08/28/2016   HCT 42.5 08/28/2016   MCV 89.1 08/28/2016   PLT 308 08/28/2016    BMET    Component Value Date/Time   BUN 13 06/06/2022 0721   CREATININE 0.92 06/06/2022  1914   GFRNONAA >60 06/06/2022 0721   CrCl cannot be calculated (Patient's most recent lab result is older than the maximum 21 days allowed.).  COAG No results found for: "INR", "PROTIME"  Radiology No results found.   Assessment/Plan 1. Lymphedema Recommend:  No surgery or intervention at this point in time.   The Patient is CEAP C4sEpAsPr.  The patient has been wearing compression for more than 12 weeks with no or little benefit.  The patient has been exercising daily for more than 12 weeks. The patient has been elevating and taking OTC pain medications for more than 12 weeks.  None of these have have eliminated the pain related to the lymphedema or the discomfort regarding excessive swelling and venous congestion.    I have reviewed my discussion with the patient regarding lymphedema and why it  causes symptoms.  Patient will continue wearing graduated compression on a daily basis. The patient should put the compression on first thing in the morning and removing them in the evening. The patient should not sleep in the compression.   In addition, behavioral modification throughout the day will be continued.  This will include frequent elevation (such as in a recliner), use of over the counter  pain medications as needed and exercise such as walking.  The systemic causes for chronic edema such as liver, kidney and cardiac etiologies do not appear to have significant changed over the past year.    The patient has chronic , severe lymphedema with hyperpigmentation of the skin and has done MLD, skin care, medication, diet, exercise, elevation and compression for 4 weeks with no improvement,  I am recommending a lymphedema pump.  The patient still has stage 3 lymphedema and therefore, I believe that a lymph pump is needed to improve the control of the patient's lymphedema and improve the quality of life.  Additionally, a lymph pump is warranted because it will reduce the risk of cellulitis and ulceration in the future.  Patient should follow-up in six months   2. Pain in both lower extremities Recommend:  No surgery or intervention at this point in time.   The Patient is CEAP C4sEpAsPr.  The patient has been wearing compression for more than 12 weeks with no or little benefit.  The patient has been exercising daily for more than 12 weeks. The patient has been elevating and taking OTC pain medications for more than 12 weeks.  None of these have have eliminated the pain related to the lymphedema or the discomfort regarding excessive swelling and venous congestion.    I have reviewed my discussion with the patient regarding lymphedema and why it  causes symptoms.  Patient will continue wearing graduated compression on a daily basis. The patient should put the compression on first thing in the morning and removing them in the evening. The patient should not sleep in the compression.   In addition, behavioral modification throughout the day will be continued.  This will include frequent elevation (such as in a recliner), use of over the counter pain medications as needed and exercise such as walking.  The systemic causes for chronic edema such as liver, kidney and cardiac etiologies do not  appear to have significant changed over the past year.    The patient has chronic , severe lymphedema with hyperpigmentation of the skin and has done MLD, skin care, medication, diet, exercise, elevation and compression for 4 weeks with no improvement,  I am recommending a lymphedema pump.  The patient  still has stage 3 lymphedema and therefore, I believe that a lymph pump is needed to improve the control of the patient's lymphedema and improve the quality of life.  Additionally, a lymph pump is warranted because it will reduce the risk of cellulitis and ulceration in the future.  Patient should follow-up in six months   3. PAD (peripheral artery disease) (HCC) Recommend:  I do not find evidence of life style limiting vascular disease. The patient specifically denies life style limitation.  Previous noninvasive studies including ABI's of the legs do not identify significant vascular problems in the arterial system.  The patient should continue walking and begin a more formal exercise program. The patient should continue his antiplatelet therapy and aggressive treatment of the lipid abnormalities.  The patient is instructed to call the office if there is a significant change in the lower extremity symptoms, particularly if a wound develops or there is an abrupt increase in leg pain.   4. Bilateral carotid artery stenosis Recommend:  Given the patient's asymptomatic subcritical stenosis no further invasive testing or surgery at this time.  Duplex ultrasound shows 1-39% stenosis bilaterally.  Continue antiplatelet therapy as prescribed Continue management of CAD, HTN and Hyperlipidemia Healthy heart diet,  encouraged exercise at least 4 times per week  Follow up in 24-36 months with duplex ultrasound and physical exam   5. Hypertension, essential Continue antihypertensive medications as already ordered, these medications have been reviewed and there are no changes at this  time.   Levora Dredge, MD  09/03/2023 1:31 PM

## 2023-09-04 LAB — VAS US ABI WITH/WO TBI
Left ABI: 0.9
Right ABI: 0.99

## 2023-09-25 ENCOUNTER — Other Ambulatory Visit (INDEPENDENT_AMBULATORY_CARE_PROVIDER_SITE_OTHER): Payer: Self-pay | Admitting: Vascular Surgery

## 2023-10-04 DIAGNOSIS — E119 Type 2 diabetes mellitus without complications: Secondary | ICD-10-CM

## 2023-10-09 ENCOUNTER — Telehealth (INDEPENDENT_AMBULATORY_CARE_PROVIDER_SITE_OTHER): Payer: Self-pay

## 2023-10-09 NOTE — Telephone Encounter (Signed)
This was received from Sherlyn Lick at [facility] on 10/01/23:  HI Vernona Rieger, I wanted to let you know, we have been unable to contact this patient since 11/15 on either phone listed. Hopefully they call back or answer some day soon.   Kristie Cowman Sr. Media planner [employer]
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
## 2023-11-07 ENCOUNTER — Telehealth (INDEPENDENT_AMBULATORY_CARE_PROVIDER_SITE_OTHER): Payer: Self-pay

## 2023-11-07 NOTE — Telephone Encounter (Signed)
 This was received from Everlena Hoard at [employer]:  Dana Mcclure, I wanted to let you know we have never been able to get in touch with this patient. If you see or hear form her again, can you ask her to call us ?  Thank you!

## 2024-04-02 ENCOUNTER — Other Ambulatory Visit (INDEPENDENT_AMBULATORY_CARE_PROVIDER_SITE_OTHER): Payer: Self-pay

## 2024-04-02 MED ORDER — CLOPIDOGREL BISULFATE 75 MG PO TABS: 75.0000 mg | ORAL_TABLET | Freq: Every day | ORAL | 3 refills | Status: DC

## 2024-04-07 NOTE — Progress Notes (Signed)
 [facility] Cardiology at [facility] [address], [place], [place] [address]  Phone: [phone] Fax: [phone]  Date of Service: 04/08/2024  Patient Clinic Note  PCP: Referring Provider:  Rudolpho Norleen BIRCH, MD [address] [facility] [place] [address] Phone: [phone] Fax: [phone] Sharlee Ozell PARAS, MD PhD [address] [place],  [place] [address] Phone: [phone] Fax: [phone]    Assessment and Plan:   Dana Mcclure is a 78 y.o. female with past medical history of former tobacco use, heart failure with preserved ejection fraction, atrial fibrillation hypertension, PAD with stenting in her bilateral lower extremity, coronary artery calcification noted on CT chest, who presents for cardiology evaluation.  Assessment & Plan Heart failure with preserved ejection fraction Fluid status stable. Jardiance may contribute to UTIs. No significant fluid overload. - Discontinue Jardiance. - Continue torsemide 20 mg daily. - Continue spironolactone 25 mg daily. - Advise daily weight monitoring. If weight increases by 3 lbs in one day or 5 lbs in three days, take extra torsemide.  Paroxysmal atrial fibrillation Intermittent episodes managed with metoprolol and Eliquis. Monitor for increased frequency. - Continue metoprolol succinate 25 mg daily. - Continue Eliquis. - Use metoprolol tartrate 25 mg as needed for episodes. - Consider increasing daily metoprolol if episodes increase.  Orthostatic hypotension Intermittent dizziness likely related to orthostatic hypotension. Jardiance discontinuation may improve symptoms. - Discontinue Jardiance. - Consider reducing losartan if symptoms persist. - Advise on hydration and compression stockings. - Educated on standing slowly.  Hyperlipidemia, coronary artery disease noted on CT chest Statin intolerance Muscle aches likely related to atorvastatin . Plan to switch to rosuvastatin after break. - Discontinue  atorvastatin  for one month. - Start rosuvastatin after one month if tolerated. - If not tolerated will transition to Zetia  Iron deficiency anemia Oral iron supplementation may be insufficient. Hematology to assess need for further infusions. - Order iron panel to assess status. - Coordinate with hematology for potential further infusions.  Seeing them next month  Hypertension Moderate left ventricular hypertrophy noted on echocardiography - Controlled.  Continue losartan 25 spironolactone 25 and metoprolol as above.  History of peripheral arterial disease - With 2 lower extremity arterial stents, [place], 2023.  Performed stent of right and left common iliac arteries. - Statin as above. - Continue Eliquis, aspirin .  Previously on Plavix  which has been discontinued in favor of apixaban. - Given iron deficiency patient may benefit from stopping aspirin .  Will continue for now and discuss at next visit.  Abnormal blood work Iron deficiency anemia  Leukocytosis Polycythemia - Received IV iron in the hospital.  Continue p.o. iron supplementation every other day. - Referral to hematology for further evaluation, set for July   Yeast infection - Likely due to [place].  1 tablet fluconazole sent to pharmacy for treatment    Lab Results  Component Value Date   CHOL 203 (H) 12/05/2023   Lab Results  Component Value Date   HDL 53 12/05/2023   Lab Results  Component Value Date   LDL 94 12/05/2023   Lab Results  Component Value Date   VLDL 56.2 (H) 12/05/2023   Lab Results  Component Value Date   CHOLHDLRATIO 3.8 12/05/2023   Lab Results  Component Value Date   TRIG 281 (H) 12/05/2023    The 10-yr ASCVD Risk score Verdon DC Jr., et al., 2013) failed to calculate due to the following reason: The 2013 10-yr ASCVD risk score is only valid if the  patient does not have prior/existing clinical ASCVD (myocardial infarction, stroke, CABG, coronary angioplasty, angina or  peripheral arterial disease, coronary atherosclerosis, ischemic heart disease, or cerebrovascular disease). The patient has prior/existing ASCVD. Has Peripheral Arterial Disease Has Cerebrovascular Disease   Lab Results  Component Value Date   A1C 7.8 (H) 10/27/2023     Return in about 3 months (around 07/09/2024).     Subjective:   Chief Complaint: Chief Complaint  Patient presents with  . Routine Follow-up     Referring Provider: Sharlee Ozell PARAS, MD PhD  History of Present Illness:    History of Present Illness   She has been experiencing muscle aches and weakness in her legs, which she associates with starting Lipitor. These symptoms began after her last visit when the medication was prescribed, with the muscle pain primarily in her thighs.  She experiences episodes of lightheadedness, particularly when walking or standing. A recent incident occurred where she felt faint while checking in for her appointment, necessitating the use of a wheelchair. She also experienced lightheadedness while getting into a car. These episodes occur more frequently when she is walking or standing still, rather than when she is sitting or lying down.  She has had four or five episodes of atrial fibrillation since her last visit. During two of these episodes, she took an extra dose of metoprolol, but it took over an hour for her heart rate to decrease. She does not identify any specific triggers for these episodes, although one occurred while she was preparing to do housework.  She is currently taking torsemide daily, which causes frequent urination. She consumes four to five cups of ice daily, as reported by her family member, and has a history of iron deficiency, for which she is on iron supplements. She previously required four iron infusions in January.  She is on Eliquis for atrial fibrillation and reports no bleeding issues such as blood in her urine or stool. She is also taking  spironolactone and metoprolol daily, with an as-needed dose of metoprolol for atrial fibrillation episodes.   ------ presenting HPI--- Recently admitted early January 2025 for pneumonia, briefly found to be in atrial fibrillation at the time of admission.  On antibiotics for short time, primary team concerned about medication adherence.  Overall patient notes progressive shortness of breath and dyspnea with exertion late 2024 into 2025.  In early January 2025 patient's son was concerned about tachypnea and his mother not sounding right on the phone, and brought her to [facility] for further evaluation.  She was found to have HFpEF exacerbation, was diuresed, and also noted to be intermittently in atrial fibrillation.  There was some concern at that time about pneumonia as well, but it was felt due to scarring of her pleura from pneumothorax many years ago rather than true pneumonia.  Since discharge patient has been feeling well but notes increased amount of orthostasis.  She states that every time she stands up or sits up she gets dizzy and lightheaded for about 15-20 seconds.  No falls.   No notable chest pain with exertion, no notable palpitations.  In regards to her atrial fibrillation in the hospital she was asymptomatic.    Cardiovascular History & Procedures: Cardiovascular Problems: Atrial fibrillation, paroxysmal Hypertension Hyperlipidemia Coronary artery calcium  noted on CT chest Peripheral arterial disease with history of bilateral iliac stenting 2023 Left ventricular hypertrophy Heart failure with preserved ejection fraction  Cath / PCI: PAD intervention 8/15/ 2023 -bilateral iliac artery stenting-[place]  CV Surgery: None  EP Procedures and Devices: None  Non-Invasive Evaluation(s): independently reviewed the most recent study of each modality.  Echocardiogram 08/09/2023-outside hospital-normal LV systolic function, moderate LVH, diastolic  dysfunction, RV normal size and function, no significant valvular abnormalities Zio patch 10/2023-less than 1% atrial fibrillation burden, longest episode lasting 1 hour and 13 minutes.  Symptomatic.  Occasional PVCs 1.3% burden.  Frequent short-lived SVT. CTA chest 10/26/2023-independently reviewed-moderate coronary artery calcifications including the LAD, left main and proximal circumflex, as well as RCA  Medical History: Past Medical History:  Diagnosis Date  . Carotid stenosis 09/27/2022  . Chronic heart failure with preserved ejection fraction    10/26/2023  . CKD (chronic kidney disease) 10/26/2023  . History of colon cancer 05/20/2015  . History of transient ischemic attack (TIA) 08/07/2018  . Hyperlipidemia 08/07/2018  . Hypertension, essential 08/07/2018  . Lymphedema 09/03/2023  . Peripheral arterial disease (CMS-HCC) 09/06/2022  . Pleural thickening 10/27/2023  . Pneumonia 10/26/2023    Surgical History: No past surgical history on file.  Social History:  reports that she has quit smoking. Her smoking use included cigarettes. She has never used smokeless tobacco. She reports that she does not drink alcohol and does not use drugs. Retired Equities trader at [employer]  Spends day cooking , cleaning,  Multiple grandkids     Family History: Family history is unknown by patient.  Review of Systems:  Except as noted in the HPI, the remainder of 10 systems reviewed is negative.   Allergies: Allergies  Allergen Reactions  . Atorvastatin  Muscle Pain  . Iodinated Contrast Media Rash    Medications:  Prior to Admission medications  Medication Dose, Route, Frequency  apixaban (ELIQUIS) 5 mg Tab 5 mg, Oral, 2 times a day (standard)  aspirin  (ECOTRIN) 81 MG tablet 81 mg, Daily (standard)  ferrous sulfate 324 mg (65 mg iron) EC tablet 324 mg, Oral, Every other day  losartan (COZAAR) 25 MG tablet 25 mg, Oral, Daily (standard)  metoPROLOL succinate (TOPROL-XL) 25 MG 24 hr  tablet 25 mg, Oral, Daily (standard)  metoPROLOL tartrate (LOPRESSOR) 25 MG tablet 25 mg, Oral, Daily PRN, Take 1 tablet as needed for intermittent A-fib episodes.  spironolactone (ALDACTONE) 25 MG tablet 25 mg, Daily (standard)  torsemide (DEMADEX) 20 MG tablet 20 mg, Daily (standard)  fluconazole (DIFLUCAN) 150 MG tablet 150 mg, Oral, Once  rosuvastatin (CRESTOR) 5 MG tablet 5 mg, Oral, Daily (standard)     Objective:   Vitals BP 122/57 (BP Site: L Arm, BP Position: Sitting, BP Cuff Size: Large)   Pulse 76   Wt 96.4 kg (212 lb 9.6 oz)   SpO2 96%   BMI 36.49 kg/m    Wt Readings from Last 3 Encounters:  04/08/24 96.4 kg (212 lb 9.6 oz)  01/15/24 96.6 kg (212 lb 14.4 oz)  12/05/23 96.6 kg (213 lb)    Physical Exam General:  Pleasant female sitting in chair in nad.  Neck: Supple, JVP normal.  Resp:   CTAB bilaterally with normal WOB.  Cardio:  RRR without m/r/g.  Abdomen:   Soft, non-distended, non-tender.  Extremities: Warm well-perfused bilaterally. Trace to 1+ edema bilaterally.  MSK: No joint swelling or erythema. No gross deformities.  Skin: No rashes  Neuro: CN II-XII grossly intact. Strength grossly intact.   Psych: Alert and oriented x3. Appropriate mood.    ECG (04/08/24) - independently interpreted most recent ECG in EPIC.  None today.  Last in epic independently interpreted 12/05/2023-normal sinus rhythm,  normal ECG  Most Recent Labs  Lab Results  Component Value Date   NA 137 12/05/2023   K 4.0 12/05/2023   CL 94 (L) 12/05/2023   CO2 27.0 12/05/2023   Lab Results  Component Value Date   BUN 24 (H) 12/05/2023   BUN 11 10/29/2023   Lab Results  Component Value Date   Creatinine 1.15 (H) 12/05/2023   Creatinine 0.80 10/29/2023   Lab Results  Component Value Date   PRO-BNP 348.0 (H) 10/26/2023   Lab Results  Component Value Date   Cholesterol, Total 203 (H) 12/05/2023   Triglycerides 281 (H) 12/05/2023   Cholesterol, HDL 53 12/05/2023    Cholesterol, Non-HDL, Calculated 150 (H) 12/05/2023   Cholesterol, LDL, Calculated 94 12/05/2023

## 2024-06-18 NOTE — H&P (View-Only) (Signed)
 GASTROENTEROLOGY INITIAL VISIT   Patient Profile: Dana Mcclure is a 78 y.o. female who presents to the GI Clinic for consultation at the request of Dr. Alyse for evaluation of Iron deficiency anemia. HX of colon cancer s/p ileocolic resection  PCP:  Alyse Slater Pao, MD   History of Present Illness   Dana Mcclure is a pleasant 78 year old female with whom I am familiar from doing previous colonoscopy for colon cancer surveillance.  Patient has remote history of colon cancer status post right hemicolectomy many years ago.  Follow-up colonoscopy was in 2016 by Dr. Gaylyn in 2021 by me revealed no recurrence of colon cancer.  She did have a single colonic vascular ectasia that I cauterized in 2021.  Patient's son, Dana Mcclure, mentions that the patient underwent angiography of both lower extremities and had dual stent placements performed in the summer 2023.  Patient was placed on Plavix  and then Eliquis.  Since that time, the patient has had fluctuation in her blood count with iron deficiency without symptoms of bright red blood per rectum or melena.  She has required at least 1 iron infusion for low blood hemoglobin count of 7.8. Patient is also requiring anticoagulation for a history of paroxysmal atrial fibrillation which began in January of this year, per history. Patient is s/p colonoscopy by me in 04/29/2020 - Colo-Colo anastomosis in Transverse colon, Single AVM cauterized with APC. Internal hemorrhoids. The patient currently denies any abdominal pain, nausea, vomiting, involuntary weight loss, anorexia or change in bowel habits.  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   H/O right hemicolectomy 06/19/2024   Current use of long term anticoagulation 06/19/2024   Paroxysmal atrial fibrillation (CMS/HHS-HCC) 06/19/2024   Severe obesity (BMI 35.0-39.9) with comorbidity  (CMS/HHS-HCC) 03/23/2024   Type 2 diabetes mellitus without complication, without long-term current use of insulin (CMS/HHS-HCC) 10/04/2023   Peripheral artery disease () 09/06/2022   Prediabetes 08/26/2020   Rotator cuff tendinitis, left 04/14/2019   History of transient ischemic attack (TIA) 08/07/2018   Hypertension, essential 08/07/2018   Hyperlipidemia 08/07/2018   Erythrocytosis 09/11/2016   Tobacco abuse 08/08/2016   History of colon cancer 05/20/2015    Medications: Current Outpatient Medications  Medication Sig Dispense Refill  . acetaminophen  (TYLENOL ) 325 MG tablet Take 1 tablet by mouth 2 (two) times daily.    . atorvastatin  (LIPITOR) 40 MG tablet Take 40 mg by mouth once daily    . ELIQUIS 5 mg tablet Take 5 mg by mouth 2 (two) times daily    . ferrous sulfate 324 mg (65 mg iron) EC tablet Take 324 mg by mouth every other day    . losartan (COZAAR) 50 MG tablet Take 1 tablet (50 mg total) by mouth once daily 90 tablet 3  . metoprolol SUCCinate (TOPROL-XL) 25 MG XL tablet Take 1 tablet by mouth once daily    . spironolactone (ALDACTONE) 25 MG tablet Take 1 tablet (25 mg total) by mouth once daily 30 tablet 11  . TORsemide (DEMADEX) 20 MG tablet Take 1 tablet (20 mg total) by mouth once daily 30 tablet 11  . albuterol MDI, PROVENTIL, VENTOLIN, PROAIR, HFA 90 mcg/actuation inhaler INHALE 2 INHALATIONS INTO THE LUNGS EVERY 6 (SIX) HOURS AS NEEDED FOR WHEEZING OR SHORTNESS OF BREATH (Patient not taking: Reported on 06/19/2024) 25.5 each 1  . aspirin  81  MG EC tablet Take 81 mg by mouth once daily (Patient not taking: Reported on 06/19/2024)    . clopidogreL  (PLAVIX ) 75 mg tablet Take 75 mg by mouth once daily (Patient not taking: Reported on 06/19/2024)    . empagliflozin (JARDIANCE) 25 mg tablet Take 1 tablet (25 mg total) by mouth once daily (Patient not taking: Reported on 06/19/2024) 90 tablet 3   No current facility-administered medications for this visit.    Allergies: Crestor  [rosuvastatin], Jardiance [empagliflozin], and Iodinated contrast media  Past Medical History: Past Medical History:  Diagnosis Date  . Colon cancer (CMS/HHS-HCC) 2007  . Hyperplastic colon polyp 07/09/2015  . Osteoarthritis   . Pneumothorax, right   . PUD (peptic ulcer disease)   . Tubular adenoma of colon 07/09/2015    Past Surgical History: Past Surgical History:  Procedure Laterality Date  . COLON SURGERY Right 2007  . COLONOSCOPY  07/09/2015   Tubular adenoma/Hyperplastic polyps/Repeat 101yrs/MUS  . COLONOSCOPY  04/29/2020   Hemorrhoids/Otherwise normal colon/FHx CC/PHx CC/Repeat 23yrs/TKT  . CHOLECYSTECTOMY    . EXPLORATORY LAPAROTOMY     for ectopic pregnancy     Family History: Family History  Problem Relation Name Age of Onset  . Colon polyps Daughter    . Colon polyps Son    . Colon polyps Son    . Colon cancer Neg Hx    . Liver disease Neg Hx    . Ulcers Neg Hx       Social History: Social History   Socioeconomic History  . Marital status: Single  Tobacco Use  . Smoking status: Former    Current packs/day: 0.00    Average packs/day: 0.5 packs/day for 40.0 years (20.0 ttl pk-yrs)    Types: Cigarettes    Start date: 08/08/1976    Quit date: 08/08/2016    Years since quitting: 7.8  . Smokeless tobacco: Never  Vaping Use  . Vaping status: Former  Substance and Sexual Activity  . Alcohol use: No    Alcohol/week: 0.0 standard drinks of alcohol  . Drug use: No  . Sexual activity: Defer   Social Drivers of Health   Food Insecurity: No Food Insecurity (06/04/2024)   Received from [facility]   Hunger Vital Sign   . Within the past 12 months, you worried that your food would run out before you got the money to buy more.: Never true   . Within the past 12 months, the food you bought just didn't last and you didn't have money to get more.: Never true  Transportation Needs: No Transportation Needs (06/04/2024)   [facility] from [facility]  - Transportation   . Lack of Transportation (Medical): No   . Lack of Transportation (Non-Medical): No     General Review of Systems   General: negative for - fever, chills, weight gain, weight loss, fatigue, weakness, depression, anxiety Head: no injury, migraines, headaches Eyes: no jaundice, itching, dryness, tearing, redness, vision changes Nose: no injury, bleeding Mouth/Throat: no oral ulcers, swollen neck, dry mouth, sore throat, hoarseness Endocrine: no heat/cold intolerance Respiratory: no cough, wheezing, SOB Cardiovascular: no chest pain, palpitations GI: see HPI Musculoskeletal: no joint swelling, muscle/joint pain  Neurological: no seizures, syncope, dizziness, numbness/tingling Skin: no rashes, itching Hematological and Lymphatic: easy bruising, easy bleeding    Physical Examination   Vitals:   06/19/24 1428  BP: 137/61  Pulse: 86  Temp: 36.3 C (97.4 F)   Body mass index is 35.5 kg/m.  Weight: 93.8 kg (206 lb 12.8 oz)    Wt Readings from Last 3 Encounters:  06/19/24 93.8 kg (206 lb 12.8 oz)  03/23/24 97.1 kg (214 lb)  11/01/23 98.3 kg (216 lb 12.8 oz)    General Appearance:      Patient in no distress, sitting comfortably on exam   room bench.   Head:     Atraumatic, normocephalic  Eyes:   Anicteric    Neck:    No lymphadenopathy noted, symmetrical, no JVD  Mouth:   Lips, mucosa, and tongue normal;   Lungs:     Clear to auscultation bilaterally, no wheezes/crackles/rales   Heart:  Irregular rate and rhythm, S1 and S2 normal, no murmur, rub   or gallop  Abdomen:   Rectal:     Soft, non-tender, bowel sounds active all four quadrants,     no rebound/guarding, no masses, no organomegaly.      Deferred  Extremities:   Extremities normal, atraumatic, no cyanosis or edema  Skin:   No abdominal rashes or lesions noted   Neurologic:   Moves all extremities well. Alert and oriented x 3.   Review of Data   I have reviewed the following data  today:  Provider notes:   Labs: No visits with results within 3 Month(s) from this visit.  Latest known visit with results is:  Appointment on 09/27/2023  Component Date Value  . Hemoglobin A1C 09/27/2023 7.7 (H)   . Average Blood Glucose (C* 09/27/2023 174   . Glucose 09/27/2023 154 (H)   . Sodium 09/27/2023 139   . Potassium 09/27/2023 4.2   . Chloride 09/27/2023 98   . Carbon Dioxide (CO2) 09/27/2023 26.3   . Calcium  09/27/2023 9.7   . Urea  Nitrogen (BUN) 09/27/2023 16   . Creatinine 09/27/2023 1.1   . Glomerular Filtration Ra* 09/27/2023 52 (L)   . BUN/Crea Ratio 09/27/2023 14.5   . Anion Gap w/K 09/27/2023 18.9 (H)     Imaging:   Procedures:   Also see HPI.  Assessment/Plan   Dana Mcclure is a 78 y.o. female with a PMHx of colon cancer in 2007 status post right hemicolectomy was seen today for evaluation of:   Orders   Diagnoses and all orders for this visit:  Iron deficiency anemia due to chronic blood loss -     Ambulatory Referral to Colonoscopy and Upper Endoscopy  History of colon cancer  Vascular ectasia of colon -     Ambulatory Referral to Colonoscopy and Upper Endoscopy  H/O right hemicolectomy  History of transient ischemic attack (TIA)  Current use of long term anticoagulation  Paroxysmal atrial fibrillation (CMS/HHS-HCC)   Plan shared with patient and family.  Proceed with upper and lower Endo luminal evaluation which will be followed by wireless capsule endoscopy of the small intestine.  I did share my concern that even if we did find bleeding vascular malformations and stop the bleeding, new ones can develop and rebleed at a time in the future.  What will most likely be necessary is constant outpatient monitoring of hemoglobin with a standing order for IV iron infusion on an intermittent basis.  My experience is that many of these patients, while on anticoagulants, continue to have some level of occult bleeding even when endoluminal  evaluation with therapy has been performed.  We will continue to follow to rule out other causes of blood loss. Return for COLONOSCOPY, EGD.  We will contact Dr. Jama of cardiology to advise us   on the ability to hold Eliquis for 48 hours prior to her upcoming procedures.   [facility] [address] [place], [place] [address] [phone]   This note will be shared with the patient. To hide, you must now click the Share button.

## 2024-06-19 DIAGNOSIS — Z9049 Acquired absence of other specified parts of digestive tract: Secondary | ICD-10-CM | POA: Insufficient documentation

## 2024-06-24 ENCOUNTER — Encounter: Payer: Self-pay | Admitting: Internal Medicine

## 2024-07-01 ENCOUNTER — Encounter
Admission: RE | Disposition: A | Discharge status: Home / Self Care | Payer: Self-pay | Source: Home / Self Care | Attending: Internal Medicine

## 2024-07-01 ENCOUNTER — Ambulatory Visit: Admitting: Certified Registered"

## 2024-07-01 ENCOUNTER — Ambulatory Visit
Admission: RE | Admit: 2024-07-01 | Discharge: 2024-07-01 | Disposition: A | Discharge status: Home / Self Care | Attending: Internal Medicine | Admitting: Internal Medicine

## 2024-07-01 ENCOUNTER — Encounter: Payer: Self-pay | Admitting: Internal Medicine

## 2024-07-01 ENCOUNTER — Other Ambulatory Visit: Payer: Self-pay

## 2024-07-01 DIAGNOSIS — K2970 Gastritis, unspecified, without bleeding: Secondary | ICD-10-CM | POA: Insufficient documentation

## 2024-07-01 DIAGNOSIS — I480 Paroxysmal atrial fibrillation: Secondary | ICD-10-CM | POA: Diagnosis not present

## 2024-07-01 DIAGNOSIS — Z79899 Other long term (current) drug therapy: Secondary | ICD-10-CM | POA: Diagnosis not present

## 2024-07-01 DIAGNOSIS — K5520 Angiodysplasia of colon without hemorrhage: Secondary | ICD-10-CM | POA: Diagnosis present

## 2024-07-01 DIAGNOSIS — Z8711 Personal history of peptic ulcer disease: Secondary | ICD-10-CM | POA: Diagnosis not present

## 2024-07-01 DIAGNOSIS — D500 Iron deficiency anemia secondary to blood loss (chronic): Secondary | ICD-10-CM | POA: Diagnosis present

## 2024-07-01 DIAGNOSIS — Z9049 Acquired absence of other specified parts of digestive tract: Secondary | ICD-10-CM | POA: Diagnosis not present

## 2024-07-01 DIAGNOSIS — Z7901 Long term (current) use of anticoagulants: Secondary | ICD-10-CM | POA: Diagnosis not present

## 2024-07-01 DIAGNOSIS — D175 Benign lipomatous neoplasm of intra-abdominal organs: Secondary | ICD-10-CM | POA: Diagnosis not present

## 2024-07-01 DIAGNOSIS — I10 Essential (primary) hypertension: Secondary | ICD-10-CM | POA: Insufficient documentation

## 2024-07-01 DIAGNOSIS — Z8673 Personal history of transient ischemic attack (TIA), and cerebral infarction without residual deficits: Secondary | ICD-10-CM | POA: Insufficient documentation

## 2024-07-01 DIAGNOSIS — E1151 Type 2 diabetes mellitus with diabetic peripheral angiopathy without gangrene: Secondary | ICD-10-CM | POA: Insufficient documentation

## 2024-07-01 DIAGNOSIS — Z87891 Personal history of nicotine dependence: Secondary | ICD-10-CM | POA: Insufficient documentation

## 2024-07-01 DIAGNOSIS — Z980 Intestinal bypass and anastomosis status: Secondary | ICD-10-CM | POA: Insufficient documentation

## 2024-07-01 DIAGNOSIS — Z85038 Personal history of other malignant neoplasm of large intestine: Secondary | ICD-10-CM | POA: Insufficient documentation

## 2024-07-01 HISTORY — DX: Unspecified atrial fibrillation: I48.91

## 2024-07-01 HISTORY — DX: Personal history of transient ischemic attack (TIA), and cerebral infarction without residual deficits: Z86.73

## 2024-07-01 HISTORY — PX: COLONOSCOPY: SHX5424

## 2024-07-01 HISTORY — DX: Type 2 diabetes mellitus without complications: E11.9

## 2024-07-01 HISTORY — DX: Secondary polycythemia: D75.1

## 2024-07-01 HISTORY — DX: Angiodysplasia of colon without hemorrhage: K55.20

## 2024-07-01 HISTORY — PX: ESOPHAGOGASTRODUODENOSCOPY: SHX5428

## 2024-07-01 HISTORY — DX: Paroxysmal atrial fibrillation: I48.0

## 2024-07-01 HISTORY — DX: Iron deficiency anemia, unspecified: D50.9

## 2024-07-01 HISTORY — DX: Hyperlipidemia, unspecified: E78.5

## 2024-07-01 HISTORY — DX: Personal history of other malignant neoplasm of large intestine: Z85.038

## 2024-07-01 HISTORY — DX: Other shoulder lesions, left shoulder: M75.82

## 2024-07-01 SURGERY — COLONOSCOPY
Anesthesia: General

## 2024-07-01 MED ORDER — SODIUM CHLORIDE 0.9 % IV SOLN: INTRAVENOUS | Status: DC

## 2024-07-01 MED ORDER — PROPOFOL 500 MG/50ML IV EMUL
INTRAVENOUS | Status: DC | PRN
  Administered 2024-07-01: 125 ug/kg/min via INTRAVENOUS
  Administered 2024-07-01: 50 mg via INTRAVENOUS

## 2024-07-01 MED ORDER — DEXMEDETOMIDINE HCL IN NACL 80 MCG/20ML IV SOLN
INTRAVENOUS | Status: DC | PRN
  Administered 2024-07-01: 8 ug via INTRAVENOUS

## 2024-07-01 MED ORDER — LIDOCAINE HCL (CARDIAC) PF 100 MG/5ML IV SOSY
PREFILLED_SYRINGE | INTRAVENOUS | Status: DC | PRN
  Administered 2024-07-01 (×2): 100 mg via INTRAVENOUS

## 2024-07-01 NOTE — Anesthesia Postprocedure Evaluation (Signed)
 Anesthesia Post Note  Patient: Dana Mcclure  Procedure(s) Performed: COLONOSCOPY EGD (ESOPHAGOGASTRODUODENOSCOPY)  Patient location during evaluation: PACU Anesthesia Type: General Level of consciousness: awake and alert Pain management: pain level controlled Vital Signs Assessment: post-procedure vital signs reviewed and stable Respiratory status: spontaneous breathing, nonlabored ventilation, respiratory function stable and patient connected to nasal cannula oxygen Cardiovascular status: blood pressure returned to baseline and stable Postop Assessment: no apparent nausea or vomiting Anesthetic complications: no   There were no known notable events for this encounter.   Last Vitals:  Vitals:   07/01/24 1009 07/01/24 1019  BP: (!) 120/106 (!) 155/61  Pulse: 73 73  Resp: (!) 23 (!) 23  Temp:    SpO2: 95% 97%    Last Pain:  Vitals:   07/01/24 1019  TempSrc:   PainSc: 0-No pain                 Lynwood KANDICE Clause

## 2024-07-01 NOTE — Op Note (Signed)
 [facility] Gastroenterology Patient Name: Dana Mcclure Procedure Date: 07/01/2024 9:26 AM MRN: [id number] Account #: [id number] Date of Birth: 11-Apr-1946 Admit Type: Outpatient Age: 78 Room: [facility] ENDO ROOM 1 Gender: Female Note Status: Finalized Instrument Name: Colon Scope [id number] Procedure:             Colonoscopy Indications:           Iron deficiency anemia secondary to chronic blood                         loss, Angioectasia Providers:             Brogen Duell K. Aundria MD, MD Referring MD:          Norleen CHARM Rower, MD (Referring MD) Medicines:             Propofol  per Anesthesia Complications:         No immediate complications. Estimated blood loss: None. Procedure:             Pre-Anesthesia Assessment:                        - The risks and benefits of the procedure and the                         sedation options and risks were discussed with the                         patient. All questions were answered and informed                         consent was obtained.                        - Patient identification and proposed procedure were                         verified prior to the procedure by the nurse. The                         procedure was verified in the procedure room.                        - ASA Grade Assessment: III - A patient with severe                         systemic disease.                        - After reviewing the risks and benefits, the patient                         was deemed in satisfactory condition to undergo the                         procedure.                        After obtaining informed consent, the colonoscope was  passed under direct vision. Throughout the procedure,                         the patient's blood pressure, pulse, and oxygen                         saturations were monitored continuously. The                         Colonoscope was introduced through the anus and                          advanced to the the ileocolonic anastomosis. The                         colonoscopy was performed without difficulty. The                         patient tolerated the procedure well. The quality of                         the bowel preparation was adequate. Ileocolonic                         anastomosis and neoterminal ileum were photographed. Findings:      The perianal and digital rectal examinations were normal. Pertinent       negatives include normal sphincter tone and no palpable rectal lesions.      A tattoo was seen in the sigmoid colon. The tattoo site appeared normal.      There was a small lipoma, 25 mm in diameter, in the sigmoid colon.       Estimated blood loss: none.      There was evidence of a prior end-to-side ileo-colonic anastomosis in       the transverse colon. This was patent and was characterized by healthy       appearing mucosa. The anastomosis was traversed.      The exam was otherwise without abnormality on direct and retroflexion       views. Impression:            - A tattoo was seen in the sigmoid colon. The tattoo                         site appeared normal.                        - Small lipoma in the sigmoid colon.                        - Patent end-to-side ileo-colonic anastomosis,                         characterized by healthy appearing mucosa.                        - The examination was otherwise normal on direct and                         retroflexion views.                        -  No specimens collected. Recommendation:        - Await pathology results from EGD, also performed                         today.                        - Patient has a contact number available for                         emergencies. The signs and symptoms of potential                         delayed complications were discussed with the patient.                         Return to normal activities tomorrow. Written                         discharge  instructions were provided to the patient.                        - Resume previous diet.                        - Continue present medications.                        - You do NOT require further colon cancer screening                         measures (Annual stool testing (i.e. hemoccult, FIT,                         cologuard), sigmoidoscopy, colonoscopy or CT                         colonography). You should share this recommendation                         with your Primary Care provider.                        - I will advise wireless capsule endoscopy of the                         small intestine if small bowel biopsies are negative                         for celiac sprue.                        - Return to my office in 3 months.                        - The findings and recommendations were discussed with                         the patient. Procedure Code(s):     --- Professional ---  54621, Colonoscopy, flexible; diagnostic, including                         collection of specimen(s) by brushing or washing, when                         performed (separate procedure) Diagnosis Code(s):     --- Professional ---                        K55.20, Angiodysplasia of colon without hemorrhage                        D50.0, Iron deficiency anemia secondary to blood loss                         (chronic)                        Z98.0, Intestinal bypass and anastomosis status                        D17.5, Benign lipomatous neoplasm of intra-abdominal                         organs CPT copyright 2022 American Medical Association. All rights reserved. The codes documented in this report are preliminary and upon coder review may  be revised to meet current compliance requirements. Ladell MARLA Boss MD, MD 07/01/2024 9:51:53 AM This report has been signed electronically. Number of Addenda: 0 Note Initiated On: 07/01/2024 9:26 AM Scope Withdrawal Time: 0 hours 3 minutes 49  seconds  Total Procedure Duration: 0 hours 7 minutes 36 seconds  Estimated Blood Loss:  Estimated blood loss: none. Estimated blood loss: none.      [facility] [place]

## 2024-07-01 NOTE — Anesthesia Preprocedure Evaluation (Signed)
 Anesthesia Evaluation  Patient identified by MRN, date of birth, ID band Patient awake    Reviewed: Allergy & Precautions, H&P , NPO status , Patient's Chart, lab work & pertinent test results, reviewed documented beta blocker date and time   Airway Mallampati: II   Neck ROM: full    Dental  (+) Poor Dentition   Pulmonary neg pulmonary ROS, former smoker   Pulmonary exam normal        Cardiovascular Exercise Tolerance: Poor hypertension, On Medications + Peripheral Vascular Disease  Normal cardiovascular exam Rhythm:regular Rate:Normal     Neuro/Psych TIA negative psych ROS   GI/Hepatic Neg liver ROS, PUD,,,  Endo/Other  negative endocrine ROSdiabetes    Renal/GU negative Renal ROS  negative genitourinary   Musculoskeletal   Abdominal   Peds  Hematology  (+) Blood dyscrasia, anemia   Anesthesia Other Findings Past Medical History: No date: Arthritis No date: Atherosclerosis of native artery of both lower extremities  with intermittent claudication (HCC) No date: Atrial fibrillation (HCC) No date: Cancer (HCC) No date: Colon cancer (HCC) No date: Diabetes mellitus without complication [facility])     Comment:  Sept 2025 pt reports pre-diabetic, not taking any               diabetes meds No date: Erythrocytosis No date: History of colon cancer No date: History of transient ischemic attack No date: Hyperlipidemia No date: Hyperplastic colon polyp No date: Hypertension No date: Iron deficiency anemia No date: Paroxysmal atrial fibrillation (HCC) No date: Pneumothorax on right No date: PUD (peptic ulcer disease) No date: Rotator cuff tendinitis, left 05/02/2016: TIA (transient ischemic attack)     Comment:  no deficits No date: Tubular adenoma of colon No date: Vascular ectasia of colon No date: Wears dentures     Comment:  partial upper and lower No date: 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 Past Surgical  History: No date: CHOLECYSTECTOMY No date: COLON SURGERY 2017: COLONOSCOPY 07/09/2015: COLONOSCOPY WITH PROPOFOL ; N/A     Comment:  Procedure: COLONOSCOPY WITH PROPOFOL ;  Surgeon: Gladis RAYMOND Mariner, MD;  Location: [facility] ENDOSCOPY;  Service:               Endoscopy;  Laterality: N/A; 04/29/2020: COLONOSCOPY WITH PROPOFOL ; N/A     Comment:  Procedure: COLONOSCOPY WITH PROPOFOL ;  Surgeon: [place],               Ladell POUR, MD;  Location: [facility] ENDOSCOPY;  Service:               Gastroenterology;  Laterality: N/A; No date: DIAGNOSTIC LAPAROSCOPY No date: EXPLORATORY LAPAROTOMY 06/06/2022: LOWER EXTREMITY ANGIOGRAPHY; Left     Comment:  Procedure: Lower Extremity Angiography;  Surgeon:               Jama Cordella MATSU, MD;  Location: [facility] INVASIVE CV LAB;               Service: Cardiovascular;  Laterality: Left; 06/29/2023: MICROLARYNGOSCOPY; Bilateral     Comment:  Procedure: MICRO-DIRECT LARYNGOSCOPY WITH BIOSPY;                Surgeon: Herminio Miu, MD;  Location: [facility] SURGERY               CNTR;  Service: ENT;  Laterality: Bilateral; BMI    Body Mass Index: 35.02 kg/m     Reproductive/Obstetrics negative OB ROS  Anesthesia Physical Anesthesia Plan  ASA: 3  Anesthesia Plan: General   Post-op Pain Management:    Induction:   PONV Risk Score and Plan:   Airway Management Planned:   Additional Equipment:   Intra-op Plan:   Post-operative Plan:   Informed Consent: I have reviewed the patients History and Physical, chart, labs and discussed the procedure including the risks, benefits and alternatives for the proposed anesthesia with the patient or authorized representative who has indicated his/her understanding and acceptance.     Dental Advisory Given  Plan Discussed with: CRNA  Anesthesia Plan Comments:         Anesthesia Quick Evaluation

## 2024-07-01 NOTE — Op Note (Signed)
 [facility] Gastroenterology Patient Name: Dana Mcclure Procedure Date: 07/01/2024 9:27 AM MRN: [id number] Account #: [id number] Date of Birth: 1946-02-17 Admit Type: Outpatient Age: 78 Room: [facility] ENDO ROOM 1 Gender: Female Note Status: Finalized Instrument Name: Upper GI Scope [id number] Procedure:             Upper GI endoscopy Indications:           Iron deficiency anemia secondary to chronic blood loss Providers:             Kierstan Auer K. Aundria MD, MD Referring MD:          Norleen CHARM Rower, MD (Referring MD) Medicines:             Propofol  per Anesthesia Complications:         No immediate complications. Estimated blood loss:                         Minimal. Procedure:             Pre-Anesthesia Assessment:                        - The risks and benefits of the procedure and the                         sedation options and risks were discussed with the                         patient. All questions were answered and informed                         consent was obtained.                        - Patient identification and proposed procedure were                         verified prior to the procedure by the nurse. The                         procedure was verified in the procedure room.                        - ASA Grade Assessment: III - A patient with severe                         systemic disease.                        - After reviewing the risks and benefits, the patient                         was deemed in satisfactory condition to undergo the                         procedure.                        After obtaining informed consent, the endoscope was  passed under direct vision. Throughout the procedure,                         the patient's blood pressure, pulse, and oxygen                         saturations were monitored continuously. The Endoscope                         was introduced through the mouth, and advanced to the                          third part of duodenum. The upper GI endoscopy was                         accomplished without difficulty. The patient tolerated                         the procedure well. Findings:      The esophagus was normal.      Diffuse mild inflammation characterized by congestion (edema) and       erythema was found in the entire examined stomach. Estimated blood loss:       none.      The exam of the stomach was otherwise normal.      The examined duodenum was normal. Biopsies for histology were taken with       a cold forceps for evaluation of celiac disease. Estimated blood loss       was minimal. Impression:            - Normal esophagus.                        - Gastritis.                        - Normal examined duodenum. Biopsied. Recommendation:        - Await pathology results.                        - Proceed with colonoscopy Procedure Code(s):     --- Professional ---                        [phone], Esophagogastroduodenoscopy, flexible,                         transoral; with biopsy, single or multiple Diagnosis Code(s):     --- Professional ---                        D50.0, Iron deficiency anemia secondary to blood loss                         (chronic)                        K29.70, Gastritis, unspecified, without bleeding CPT copyright 2022 American Medical Association. All rights reserved. The codes documented in this report are preliminary and upon coder review may  be revised to meet current compliance requirements. Ladell MARLA Boss MD, MD 07/01/2024 9:36:01 AM This report has been  signed electronically. Number of Addenda: 0 Note Initiated On: 07/01/2024 9:27 AM Estimated Blood Loss:  Estimated blood loss: none. Estimated blood loss was                         minimal.      [facility]

## 2024-07-01 NOTE — Interval H&P Note (Signed)
 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  The various methods of treatment have been discussed with the patient and family. After consideration of risks, benefits and other options for treatment, the patient has consented to  Procedure(s): COLONOSCOPY (N/A) EGD (ESOPHAGOGASTRODUODENOSCOPY) (N/A) as a surgical intervention.  The patient's history has been reviewed, patient examined, no change in status, stable for surgery.  I have reviewed the patient's chart and labs.  Questions were answered to the patient's satisfaction.     [place], [name]

## 2024-07-01 NOTE — H&P (Signed)
 Outpatient short stay form Pre-procedure 07/08/2024 3:44 PM Dana Mcclure, M.D.  Primary Physician: Dana Mcclure, M.D.  Reason for visit:  Iron deficiency secondary to blood loss  History of present illness:  Dana Mcclure is a pleasant 78 year old female with whom I am familiar from doing previous colonoscopy for colon cancer surveillance.  Patient has remote history of colon cancer status post right hemicolectomy many years ago.  Follow-up colonoscopy was in 2016 by Dr. Gaylyn in 2021 by me revealed no recurrence of colon cancer.  She did have a single colonic vascular ectasia that I cauterized in 2021.  Patient's son, Dana Mcclure, mentions that the patient underwent angiography of both lower extremities and had dual stent placements performed in the summer 2023.  Patient was placed on Plavix  and then Eliquis.  Since that time, the patient has had fluctuation in her blood count with iron deficiency without symptoms of bright red blood per rectum or melena.  She has required at least 1 iron infusion for low blood hemoglobin count of 7.8. Patient is also requiring anticoagulation for a history of paroxysmal atrial fibrillation which began in January of this year, per history. Patient is s/p colonoscopy by me in 04/29/2020 - Colo-Colo anastomosis in Transverse colon, Single AVM cauterized with APC. Internal hemorrhoids. The patient currently denies any abdominal pain, nausea, vomiting, involuntary weight loss, anorexia or change in bowel habits.     No current facility-administered medications for this encounter.  Current Outpatient Medications:    apixaban (ELIQUIS) 2.5 MG TABS tablet, Take 2.5 mg by mouth 2 (two) times daily., Disp: , Rfl:    losartan (COZAAR) 50 MG tablet, Take 1 tablet by mouth daily., Disp: , Rfl:    metoprolol tartrate (LOPRESSOR) 25 MG tablet, Take 25 mg by mouth 2 (two) times daily., Disp: , Rfl:    spironolactone (ALDACTONE) 25 MG tablet, Take 25 mg by mouth daily.,  Disp: , Rfl:    torsemide (DEMADEX) 20 MG tablet, Take 20 mg by mouth daily., Disp: , Rfl:    acetaminophen  (TYLENOL ) 325 MG tablet, Take 650 mg by mouth every 4 (four) hours as needed., Disp: , Rfl:    amLODipine (NORVASC) 10 MG tablet, Take 10 mg by mouth daily. (Patient not taking: Reported on 07/01/2024), Disp: , Rfl:    aspirin  EC 81 MG tablet, 81 mg once daily., Disp: , Rfl:    clopidogrel  (PLAVIX ) 75 MG tablet, Take 1 tablet (75 mg total) by mouth daily. (Patient not taking: Reported on 07/01/2024), Disp: 90 tablet, Rfl: 3   empagliflozin (JARDIANCE) 10 MG TABS tablet, Take 1 tablet by mouth daily. (Patient not taking: Reported on 07/01/2024), Disp: , Rfl:    furosemide (LASIX) 20 MG tablet, Take 1 tablet by mouth daily. (Patient not taking: Reported on 07/01/2024), Disp: , Rfl:    hydrochlorothiazide (HYDRODIURIL) 12.5 MG tablet, Take 12.5 mg by mouth daily. (Patient not taking: Reported on 07/01/2024), Disp: , Rfl:    naproxen sodium (ALEVE) 220 MG tablet, Take 220 mg by mouth daily as needed., Disp: , Rfl:   No medications prior to admission.     Allergies  Allergen Reactions   Empagliflozin Other (See Comments)    YEAST INFECTIONS   Rosuvastatin Nausea And Vomiting   Iodinated Contrast Media Rash     Past Medical History:  Diagnosis Date   Arthritis    Atherosclerosis of native artery of both lower extremities with intermittent claudication (HCC)    Atrial fibrillation (HCC)  Cancer [facility])    Colon cancer (HCC)    Diabetes mellitus without complication [facility])    Sept 2025 pt reports pre-diabetic, not taking any diabetes meds   Erythrocytosis    History of colon cancer    History of transient ischemic attack    Hyperlipidemia    Hyperplastic colon polyp    Hypertension    Iron deficiency anemia    Paroxysmal atrial fibrillation (HCC)    Pneumothorax on right    PUD (peptic ulcer disease)    Rotator cuff tendinitis, left    TIA (transient ischemic attack) 05/02/2016   no  deficits   Tubular adenoma of colon    Vascular ectasia of colon    Wears dentures    partial upper and lower   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    Physical Exam  Gen: Alert, oriented. Appears stated age.  HEENT: [place]/AT. PERRLA. Lungs: CTA, no wheezes. CV: RR nl S1, S2. Abd: soft, benign, no masses. BS+ Ext: No edema. Pulses 2+    Planned procedures: Proceed with EGD and colonoscopy. The patient understands the nature of the planned procedure, indications, risks, alternatives and potential complications including but not limited to bleeding, infection, perforation, damage to internal organs and possible oversedation/side effects from anesthesia. The patient agrees and gives consent to proceed.  Please refer to procedure notes for findings, recommendations and patient disposition/instructions.     Dana Mcclure K. Mcclure, M.D. Gastroenterology 07/08/2024  3:44 PM

## 2024-07-01 NOTE — Transfer of Care (Signed)
 Immediate Anesthesia Transfer of Care Note  Patient: Dana Mcclure  Procedure(s) Performed: COLONOSCOPY EGD (ESOPHAGOGASTRODUODENOSCOPY)  Patient Location: PACU  Anesthesia Type:General  Level of Consciousness: drowsy and patient cooperative  Airway & Oxygen Therapy: Patient Spontanous Breathing and Patient connected to nasal cannula oxygen  Post-op Assessment: Report given to RN and Post -op Vital signs reviewed and stable  Post vital signs: stable  Last Vitals:  Vitals Value Taken Time  BP    Temp 35.6 C 07/01/24 09:49  Pulse 69 07/01/24 09:49  Resp 15 07/01/24 09:49  SpO2 95 % 07/01/24 09:49  Vitals shown include unfiled device data.  Last Pain:  Vitals:   07/01/24 0949  TempSrc: Rectal  PainSc: 0-No pain         Complications: No notable events documented.

## 2024-07-02 ENCOUNTER — Encounter: Payer: Self-pay | Admitting: Internal Medicine

## 2024-07-02 LAB — SURGICAL PATHOLOGY

## 2024-07-29 NOTE — Progress Notes (Signed)
 Pt returns to office w/o complaints.  Data recorder downloaded. Discharge instructions given to pt and verbalized understanding. _Dr. Zoraida to review downloaded material.

## 2024-07-31 ENCOUNTER — Emergency Department

## 2024-07-31 ENCOUNTER — Inpatient Hospital Stay
Admission: EM | Admit: 2024-07-31 | Discharge: 2024-08-04 | DRG: 871 | Disposition: A | Discharge status: Home Health | Attending: Obstetrics and Gynecology | Admitting: Obstetrics and Gynecology

## 2024-07-31 ENCOUNTER — Other Ambulatory Visit: Payer: Self-pay

## 2024-07-31 ENCOUNTER — Encounter: Payer: Self-pay | Admitting: Emergency Medicine

## 2024-07-31 DIAGNOSIS — R197 Diarrhea, unspecified: Secondary | ICD-10-CM | POA: Diagnosis present

## 2024-07-31 DIAGNOSIS — R0902 Hypoxemia: Secondary | ICD-10-CM

## 2024-07-31 DIAGNOSIS — E876 Hypokalemia: Secondary | ICD-10-CM | POA: Diagnosis present

## 2024-07-31 DIAGNOSIS — Z881 Allergy status to other antibiotic agents status: Secondary | ICD-10-CM

## 2024-07-31 DIAGNOSIS — Z720 Tobacco use: Secondary | ICD-10-CM | POA: Diagnosis present

## 2024-07-31 DIAGNOSIS — Z85038 Personal history of other malignant neoplasm of large intestine: Secondary | ICD-10-CM | POA: Diagnosis present

## 2024-07-31 DIAGNOSIS — Z888 Allergy status to other drugs, medicaments and biological substances status: Secondary | ICD-10-CM

## 2024-07-31 DIAGNOSIS — R8281 Pyuria: Secondary | ICD-10-CM | POA: Diagnosis present

## 2024-07-31 DIAGNOSIS — D509 Iron deficiency anemia, unspecified: Secondary | ICD-10-CM | POA: Diagnosis present

## 2024-07-31 DIAGNOSIS — J189 Pneumonia, unspecified organism: Secondary | ICD-10-CM | POA: Diagnosis not present

## 2024-07-31 DIAGNOSIS — A419 Sepsis, unspecified organism: Secondary | ICD-10-CM | POA: Diagnosis not present

## 2024-07-31 DIAGNOSIS — Z6833 Body mass index (BMI) 33.0-33.9, adult: Secondary | ICD-10-CM

## 2024-07-31 DIAGNOSIS — G9341 Metabolic encephalopathy: Secondary | ICD-10-CM | POA: Diagnosis not present

## 2024-07-31 DIAGNOSIS — Z7901 Long term (current) use of anticoagulants: Secondary | ICD-10-CM

## 2024-07-31 DIAGNOSIS — Z974 Presence of external hearing-aid: Secondary | ICD-10-CM

## 2024-07-31 DIAGNOSIS — E119 Type 2 diabetes mellitus without complications: Secondary | ICD-10-CM

## 2024-07-31 DIAGNOSIS — J9601 Acute respiratory failure with hypoxia: Secondary | ICD-10-CM | POA: Diagnosis present

## 2024-07-31 DIAGNOSIS — Z91041 Radiographic dye allergy status: Secondary | ICD-10-CM

## 2024-07-31 DIAGNOSIS — I10 Essential (primary) hypertension: Secondary | ICD-10-CM | POA: Diagnosis present

## 2024-07-31 DIAGNOSIS — I480 Paroxysmal atrial fibrillation: Secondary | ICD-10-CM | POA: Diagnosis present

## 2024-07-31 DIAGNOSIS — R739 Hyperglycemia, unspecified: Secondary | ICD-10-CM

## 2024-07-31 DIAGNOSIS — E1122 Type 2 diabetes mellitus with diabetic chronic kidney disease: Secondary | ICD-10-CM | POA: Diagnosis present

## 2024-07-31 DIAGNOSIS — R5381 Other malaise: Secondary | ICD-10-CM | POA: Diagnosis present

## 2024-07-31 DIAGNOSIS — Z8673 Personal history of transient ischemic attack (TIA), and cerebral infarction without residual deficits: Secondary | ICD-10-CM

## 2024-07-31 DIAGNOSIS — N1832 Chronic kidney disease, stage 3b: Secondary | ICD-10-CM | POA: Diagnosis present

## 2024-07-31 DIAGNOSIS — Z79899 Other long term (current) drug therapy: Secondary | ICD-10-CM

## 2024-07-31 DIAGNOSIS — Z7902 Long term (current) use of antithrombotics/antiplatelets: Secondary | ICD-10-CM

## 2024-07-31 DIAGNOSIS — E1165 Type 2 diabetes mellitus with hyperglycemia: Secondary | ICD-10-CM | POA: Diagnosis present

## 2024-07-31 DIAGNOSIS — Z860101 Personal history of adenomatous and serrated colon polyps: Secondary | ICD-10-CM

## 2024-07-31 DIAGNOSIS — I5032 Chronic diastolic (congestive) heart failure: Secondary | ICD-10-CM | POA: Diagnosis present

## 2024-07-31 DIAGNOSIS — Z83719 Family history of colon polyps, unspecified: Secondary | ICD-10-CM

## 2024-07-31 DIAGNOSIS — Z95820 Peripheral vascular angioplasty status with implants and grafts: Secondary | ICD-10-CM

## 2024-07-31 DIAGNOSIS — E785 Hyperlipidemia, unspecified: Secondary | ICD-10-CM | POA: Diagnosis present

## 2024-07-31 DIAGNOSIS — E871 Hypo-osmolality and hyponatremia: Secondary | ICD-10-CM | POA: Diagnosis present

## 2024-07-31 DIAGNOSIS — E872 Acidosis, unspecified: Secondary | ICD-10-CM | POA: Diagnosis present

## 2024-07-31 DIAGNOSIS — R6520 Severe sepsis without septic shock: Secondary | ICD-10-CM | POA: Diagnosis present

## 2024-07-31 DIAGNOSIS — I739 Peripheral vascular disease, unspecified: Secondary | ICD-10-CM | POA: Diagnosis present

## 2024-07-31 DIAGNOSIS — N201 Calculus of ureter: Secondary | ICD-10-CM | POA: Diagnosis present

## 2024-07-31 DIAGNOSIS — I130 Hypertensive heart and chronic kidney disease with heart failure and stage 1 through stage 4 chronic kidney disease, or unspecified chronic kidney disease: Secondary | ICD-10-CM | POA: Diagnosis present

## 2024-07-31 DIAGNOSIS — Z87891 Personal history of nicotine dependence: Secondary | ICD-10-CM

## 2024-07-31 DIAGNOSIS — E878 Other disorders of electrolyte and fluid balance, not elsewhere classified: Secondary | ICD-10-CM | POA: Insufficient documentation

## 2024-07-31 DIAGNOSIS — Z8711 Personal history of peptic ulcer disease: Secondary | ICD-10-CM

## 2024-07-31 DIAGNOSIS — B9689 Other specified bacterial agents as the cause of diseases classified elsewhere: Secondary | ICD-10-CM | POA: Diagnosis present

## 2024-07-31 DIAGNOSIS — E669 Obesity, unspecified: Secondary | ICD-10-CM | POA: Diagnosis present

## 2024-07-31 DIAGNOSIS — I2510 Atherosclerotic heart disease of native coronary artery without angina pectoris: Secondary | ICD-10-CM | POA: Diagnosis present

## 2024-07-31 DIAGNOSIS — E1151 Type 2 diabetes mellitus with diabetic peripheral angiopathy without gangrene: Secondary | ICD-10-CM | POA: Diagnosis present

## 2024-07-31 DIAGNOSIS — R7989 Other specified abnormal findings of blood chemistry: Secondary | ICD-10-CM

## 2024-07-31 LAB — URINALYSIS, W/ REFLEX TO CULTURE (INFECTION SUSPECTED)
Bilirubin Urine: NEGATIVE
Glucose, UA: NEGATIVE mg/dL
Ketones, ur: NEGATIVE mg/dL
Leukocytes,Ua: NEGATIVE
Nitrite: POSITIVE — AB
Protein, ur: 30 mg/dL — AB
Specific Gravity, Urine: 1.013 (ref 1.005–1.030)
pH: 5 (ref 5.0–8.0)

## 2024-07-31 LAB — LACTIC ACID, PLASMA
Lactic Acid, Venous: 2.7 mmol/L (ref 0.5–1.9)
Lactic Acid, Venous: 2.4 mmol/L (ref 0.5–1.9)

## 2024-07-31 LAB — HEPATIC FUNCTION PANEL
ALT: 13 U/L (ref 0–44)
AST: 24 U/L (ref 15–41)
Albumin: 3.6 g/dL (ref 3.5–5.0)
Alkaline Phosphatase: 40 U/L (ref 38–126)
Bilirubin, Direct: <0.1 mg/dL (ref 0.0–0.2)
Total Bilirubin: 0.6 mg/dL (ref 0.0–1.2)
Total Protein: 7.3 g/dL (ref 6.5–8.1)

## 2024-07-31 LAB — BASIC METABOLIC PANEL WITH GFR
Anion gap: 14 (ref 5–15)
BUN: 16 mg/dL (ref 8–23)
CO2: 24 mmol/L (ref 22–32)
Calcium: 9.1 mg/dL (ref 8.9–10.3)
Chloride: 96 mmol/L — ABNORMAL LOW (ref 98–111)
Creatinine, Ser: 1.3 mg/dL — ABNORMAL HIGH (ref 0.44–1.00)
GFR, Estimated: 42 mL/min — ABNORMAL LOW (ref >60)
Glucose, Bld: 221 mg/dL — ABNORMAL HIGH (ref 70–99)
Potassium: 3.2 mmol/L — ABNORMAL LOW (ref 3.5–5.1)
Sodium: 134 mmol/L — ABNORMAL LOW (ref 135–145)

## 2024-07-31 LAB — CBC
HCT: 34.4 % — ABNORMAL LOW (ref 36.0–46.0)
Hemoglobin: 11.4 g/dL — ABNORMAL LOW (ref 12.0–15.0)
MCH: 28.6 pg (ref 26.0–34.0)
MCHC: 33.1 g/dL (ref 30.0–36.0)
MCV: 86.4 fL (ref 80.0–100.0)
Platelets: 401 K/uL — ABNORMAL HIGH (ref 150–400)
RBC: 3.98 MIL/uL (ref 3.87–5.11)
RDW: 16.9 % — ABNORMAL HIGH (ref 11.5–15.5)
WBC: 22.6 K/uL — ABNORMAL HIGH (ref 4.0–10.5)
nRBC: 0 % (ref 0.0–0.2)

## 2024-07-31 LAB — BRAIN NATRIURETIC PEPTIDE: B Natriuretic Peptide: 243.9 pg/mL — ABNORMAL HIGH (ref 0.0–100.0)

## 2024-07-31 LAB — BLOOD GAS, VENOUS
Acid-Base Excess: 2.3 mmol/L — ABNORMAL HIGH (ref 0.0–2.0)
Bicarbonate: 26.5 mmol/L (ref 20.0–28.0)
O2 Saturation: 84.5 %
Patient temperature: 37
pCO2, Ven: 39 mmHg — ABNORMAL LOW (ref 44–60)
pH, Ven: 7.44 — ABNORMAL HIGH (ref 7.25–7.43)
pO2, Ven: 47 mmHg — ABNORMAL HIGH (ref 32–45)

## 2024-07-31 LAB — RESP PANEL BY RT-PCR (RSV, FLU A&B, COVID)  RVPGX2
Influenza A by PCR: NEGATIVE
Influenza B by PCR: NEGATIVE
Resp Syncytial Virus by PCR: NEGATIVE
SARS Coronavirus 2 by RT PCR: NEGATIVE

## 2024-07-31 LAB — TROPONIN I (HIGH SENSITIVITY)
Troponin I (High Sensitivity): 44 ng/L — ABNORMAL HIGH (ref <18)
Troponin I (High Sensitivity): 44 ng/L — ABNORMAL HIGH (ref <18)

## 2024-07-31 LAB — PROTIME-INR
INR: 1.3 — ABNORMAL HIGH (ref 0.8–1.2)
Prothrombin Time: 16.7 s — ABNORMAL HIGH (ref 11.4–15.2)

## 2024-07-31 MED ORDER — APIXABAN 2.5 MG PO TABS
2.5000 mg | ORAL_TABLET | Freq: Two times a day (BID) | ORAL | Status: DC
  Administered 2024-07-31 – 2024-08-01 (×3): 2.5 mg via ORAL
  Filled 2024-07-31 (×3): qty 1

## 2024-07-31 MED ORDER — ONDANSETRON HCL 4 MG PO TABS: 4.0000 mg | ORAL_TABLET | Freq: Four times a day (QID) | ORAL | Status: DC | PRN

## 2024-07-31 MED ORDER — HYDROCODONE-ACETAMINOPHEN 5-325 MG PO TABS: 1.0000 | ORAL_TABLET | ORAL | Status: DC | PRN

## 2024-07-31 MED ORDER — SODIUM CHLORIDE 0.9 % IV SOLN
500.0000 mg | Freq: Once | INTRAVENOUS | Status: AC
  Administered 2024-07-31: 500 mg via INTRAVENOUS
  Filled 2024-07-31: qty 5

## 2024-07-31 MED ORDER — ONDANSETRON HCL 4 MG/2ML IJ SOLN: 4.0000 mg | Freq: Four times a day (QID) | INTRAMUSCULAR | Status: DC | PRN

## 2024-07-31 MED ORDER — ALBUTEROL SULFATE (2.5 MG/3ML) 0.083% IN NEBU
2.5000 mg | INHALATION_SOLUTION | RESPIRATORY_TRACT | Status: DC | PRN
  Administered 2024-08-02: 2.5 mg via RESPIRATORY_TRACT
  Filled 2024-07-31: qty 3

## 2024-07-31 MED ORDER — LACTATED RINGERS IV SOLN: INTRAVENOUS | Status: AC

## 2024-07-31 MED ORDER — SODIUM CHLORIDE 0.9 % IV SOLN
2.0000 g | Freq: Once | INTRAVENOUS | Status: AC
  Administered 2024-07-31: 2 g via INTRAVENOUS
  Filled 2024-07-31: qty 20

## 2024-07-31 MED ORDER — GUAIFENESIN ER 600 MG PO TB12
600.0000 mg | ORAL_TABLET | Freq: Two times a day (BID) | ORAL | Status: DC
  Administered 2024-07-31 – 2024-08-04 (×8): 600 mg via ORAL
  Filled 2024-07-31 (×8): qty 1

## 2024-07-31 MED ORDER — LACTATED RINGERS IV SOLN
150.0000 mL/h | INTRAVENOUS | Status: DC
  Administered 2024-07-31: 150 mL/h via INTRAVENOUS

## 2024-07-31 MED ORDER — SODIUM CHLORIDE 0.9 % IV SOLN: 2.0000 g | INTRAVENOUS | Status: DC

## 2024-07-31 MED ORDER — SODIUM CHLORIDE 0.9 % IV SOLN
500.0000 mg | INTRAVENOUS | Status: DC
  Administered 2024-08-01 – 2024-08-02 (×2): 500 mg via INTRAVENOUS
  Filled 2024-07-31 (×3): qty 5

## 2024-07-31 MED ORDER — METOPROLOL TARTRATE 25 MG PO TABS
25.0000 mg | ORAL_TABLET | Freq: Two times a day (BID) | ORAL | Status: DC
Start: 2024-07-31 — End: 2024-08-02
  Administered 2024-07-31 – 2024-08-01 (×3): 25 mg via ORAL
  Filled 2024-07-31 (×3): qty 1

## 2024-07-31 MED ORDER — ACETAMINOPHEN 325 MG PO TABS
650.0000 mg | ORAL_TABLET | Freq: Four times a day (QID) | ORAL | Status: DC | PRN
  Administered 2024-08-01 (×2): 650 mg via ORAL
  Filled 2024-07-31 (×2): qty 2

## 2024-07-31 MED ORDER — LOSARTAN POTASSIUM 50 MG PO TABS
50.0000 mg | ORAL_TABLET | Freq: Every day | ORAL | Status: DC
Start: 2024-08-01 — End: 2024-08-01

## 2024-07-31 MED ORDER — SPIRONOLACTONE 25 MG PO TABS
25.0000 mg | ORAL_TABLET | Freq: Every day | ORAL | Status: DC
Start: 2024-08-01 — End: 2024-08-01

## 2024-07-31 MED ORDER — POTASSIUM CHLORIDE 10 MEQ/100ML IV SOLN
10.0000 meq | INTRAVENOUS | Status: AC
  Administered 2024-07-31 – 2024-08-01 (×3): 10 meq via INTRAVENOUS
  Filled 2024-07-31 (×3): qty 100

## 2024-07-31 MED ORDER — ACETAMINOPHEN 650 MG RE SUPP
650.0000 mg | Freq: Four times a day (QID) | RECTAL | Status: DC | PRN
Start: 2024-07-31 — End: 2024-08-01

## 2024-07-31 NOTE — Assessment & Plan Note (Signed)
 Acute respiratory failure with hypoxia Sepsis criteria include tachycardia and hypoxia with leukocytosis and lactic acidosis and respiratory failure O2 sats 88% on arrival of EMS, now requiring 4 L to maintain sats in the 90s IV fluids with monitoring for fluid overload Rocephin and azithromycin Supplemental oxygen and wean as tolerated

## 2024-07-31 NOTE — Progress Notes (Signed)
 PHARMACY CONSULT NOTE - FOLLOW UP  Pharmacy Consult for Electrolyte Monitoring and Replacement   Recent Labs: Potassium (mmol/L)  Date Value  07/31/2024 3.2 (L)   Calcium  (mg/dL)  Date Value  89/90/7974 9.1   Albumin (g/dL)  Date Value  89/90/7974 3.6   Sodium (mmol/L)  Date Value  07/31/2024 134 (L)     Assessment: 10/9:  K @ 1912 = 3.2   Goal of Therapy:  Electrolytes WNL   Plan:  - will order KCl 10 mEq IV X 3  - recheck electrolytes on 10/10 with AM labs   Adamaris King D ,PharmD Clinical Pharmacist 07/31/2024 11:01 PM

## 2024-07-31 NOTE — Assessment & Plan Note (Signed)
 Continue metoprolol and apixaban

## 2024-07-31 NOTE — Assessment & Plan Note (Signed)
 Continuing home meds with close monitoring of blood pressure in the setting of sepsis diagnosis

## 2024-07-31 NOTE — Assessment & Plan Note (Signed)
 Continue aspirin .  Not currently on statin

## 2024-07-31 NOTE — Assessment & Plan Note (Signed)
 Hemoglobin of 11.4 Had capsule endoscopy on 10/6 and had colonoscopy and endoscopy a couple months prior No acute issues suspected

## 2024-07-31 NOTE — H&P (Signed)
 History and Physical    Patient: Dana Mcclure FMW:[id number] DOB: 1945-12-20 DOA: 07/31/2024 DOS: the patient was seen and examined on 07/31/2024 PCP: [facility]  Patient coming from: Home  Chief Complaint:  Chief Complaint  Patient presents with   Weakness   Nausea   Palpitations    HPI: Dana Mcclure is a 78 y.o. female with medical history significant for PAD s/p stents in BLE, HFpEF, HTN, with most recent admission in January 2025 with pneumonia during which she was briefly in A-fib, currently on Eliquis, being admitted with sepsis secondary to multifocal pneumonia.  Patient had a recent bowel prep for capsule endoscopy on 10/6 and was doing well until  last night when she developed shortness of breath, heart palpitations and weakness.  She denied cough, fever or chills or chest pain.  With EMS O2 sat was 88% on room air, arriving on 2 L. In the ED O2 sat 88% on room air requiring up to 4 L to maintain sats in the low to mid 90s Labs notable for WBC 22,000 with lactic acid 2.7 VBG with venous pH 7.44 and pCO2 39, normal bicarb Respiratory viral panel negative Troponin 44 and BNP 243 Hemoglobin 11 BMP notable for glucose 221 with minor electrolyte abnormalities of sodium 134 potassium 3.2.  Creatinine 1.3 which is above baseline EKG showing sinus at 94 with nonspecific ST-T wave changes Chest x-ray showing patchy infiltrates possibly edema or pneumonia CT chest abdomen and pelvis without contrast showing chronic findings Patient was treated with an LR bolus started on Rocephin and azithromycin Admission requested     Review of Systems: As mentioned in the history of present illness. All other systems reviewed and are negative.  Past Medical History:  Diagnosis Date   Arthritis    Atherosclerosis of native artery of both lower extremities with intermittent claudication    Atrial fibrillation (HCC)    Cancer (HCC)    Colon cancer (HCC)    Diabetes mellitus  without complication [facility])    Sept 2025 pt reports pre-diabetic, not taking any diabetes meds   Erythrocytosis    History of colon cancer    History of transient ischemic attack    Hyperlipidemia    Hyperplastic colon polyp    Hypertension    Iron deficiency anemia    Paroxysmal atrial fibrillation (HCC)    Pneumothorax on right    PUD (peptic ulcer disease)    Rotator cuff tendinitis, left    TIA (transient ischemic attack) 05/02/2016   no deficits   Tubular adenoma of colon    Vascular ectasia of colon    Wears dentures    partial upper and lower   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    Past Surgical History:  Procedure Laterality Date   CHOLECYSTECTOMY     COLON SURGERY     COLONOSCOPY  2017   COLONOSCOPY N/A 07/01/2024   Procedure: COLONOSCOPY;  Surgeon: Toledo, Ladell POUR, MD;  Location: [facility] ENDOSCOPY;  Service: Gastroenterology;  Laterality: N/A;   COLONOSCOPY WITH PROPOFOL  N/A 07/09/2015   Procedure: COLONOSCOPY WITH PROPOFOL ;  Surgeon: Gladis RAYMOND Mariner, MD;  Location[facility] ENDOSCOPY;  Service: Endoscopy;  Laterality: N/A;   COLONOSCOPY WITH PROPOFOL  N/A 04/29/2020   Procedure: COLONOSCOPY WITH PROPOFOL ;  Surgeon: Toledo, Ladell POUR, MD;  Location: [facility] ENDOSCOPY;  Service: Gastroenterology;  Laterality: N/A;   DIAGNOSTIC LAPAROSCOPY     ESOPHAGOGASTRODUODENOSCOPY N/A 07/01/2024   Procedure: EGD (ESOPHAGOGASTRODUODENOSCOPY);  Surgeon: Aundria, Teodoro K, MD;  Location: [facility] ENDOSCOPY;  Service: Gastroenterology;  Laterality: N/A;   EXPLORATORY LAPAROTOMY     LOWER EXTREMITY ANGIOGRAPHY Left 06/06/2022   Procedure: Lower Extremity Angiography;  Surgeon: Jama Cordella MATSU, MD;  Location: [facility] INVASIVE CV LAB;  Service: Cardiovascular;  Laterality: Left;   MICROLARYNGOSCOPY Bilateral 06/29/2023   Procedure: MICRO-DIRECT LARYNGOSCOPY WITH BIOSPY;  Surgeon: Herminio Miu, MD;  Location: [facility];  Service: ENT;  Laterality: Bilateral;   Social History:  reports that she  quit smoking about 14 months ago. Her smoking use included cigarettes. She started smoking about 41 years ago. She has a 20.3 pack-year smoking history. She has never used smokeless tobacco. She reports that she does not currently use alcohol. She reports that she does not use drugs.  Allergies  Allergen Reactions   Empagliflozin Other (See Comments)    YEAST INFECTIONS   Rosuvastatin Nausea And Vomiting   Iodinated Contrast Media Rash    Family History  Problem Relation Age of Onset   Colon polyps Daughter    Colon polyps Son    Breast cancer Neg Hx     Prior to Admission medications   Medication Sig Start Date End Date Taking? Authorizing Provider  acetaminophen  (TYLENOL ) 325 MG tablet Take 650 mg by mouth every 4 (four) hours as needed.    [provider]  amLODipine (NORVASC) 10 MG tablet Take 10 mg by mouth daily. Patient not taking: Reported on 07/01/2024    [provider]  apixaban (ELIQUIS) 2.5 MG TABS tablet Take 2.5 mg by mouth 2 (two) times daily.    [provider]  aspirin  EC 81 MG tablet 81 mg once daily.    [provider]  clopidogrel  (PLAVIX ) 75 MG tablet Take 1 tablet (75 mg total) by mouth daily. Patient not taking: Reported on 07/01/2024 04/02/24   Schnier, Cordella MATSU, MD  empagliflozin (JARDIANCE) 10 MG TABS tablet Take 1 tablet by mouth daily. Patient not taking: Reported on 07/01/2024 08/15/23 08/14/24  [provider]  furosemide (LASIX) 20 MG tablet Take 1 tablet by mouth daily. Patient not taking: Reported on 07/01/2024 08/27/23   [provider]  hydrochlorothiazide (HYDRODIURIL) 12.5 MG tablet Take 12.5 mg by mouth daily. Patient not taking: Reported on 07/01/2024    [provider]  losartan (COZAAR) 50 MG tablet Take 1 tablet by mouth daily. 08/01/23 07/31/24  [provider]  metoprolol tartrate (LOPRESSOR) 25 MG tablet Take 25 mg by mouth 2 (two) times daily.    [provider]   naproxen sodium (ALEVE) 220 MG tablet Take 220 mg by mouth daily as needed.    [provider]  spironolactone (ALDACTONE) 25 MG tablet Take 25 mg by mouth daily.    [provider]  torsemide (DEMADEX) 20 MG tablet Take 20 mg by mouth daily.    [provider]    Physical Exam: Vitals:   07/31/24 1908 07/31/24 1912 07/31/24 1913  BP: (!) 152/73    Pulse: 95    Resp: 19    Temp: 98.8 F (37.1 C)    SpO2: (!) 88% (!) 88% 93%   Physical Exam Vitals and nursing note reviewed.  Constitutional:      General: She is not in acute distress. HENT:     Head: Normocephalic and atraumatic.  Cardiovascular:     Rate and Rhythm: Regular rhythm. Tachycardia present.     Heart sounds: Normal heart sounds.  Pulmonary:     Effort: Tachypnea present.  Breath sounds: Normal breath sounds.  Abdominal:     Palpations: Abdomen is soft.     Tenderness: There is no abdominal tenderness.  Neurological:     Mental Status: Mental status is at baseline.     Labs on Admission: I have personally reviewed following labs and imaging studies  CBC: Recent Labs  Lab 07/31/24 1912  WBC 22.6*  HGB 11.4*  HCT 34.4*  MCV 86.4  PLT 401*   Basic Metabolic Panel: Recent Labs  Lab 07/31/24 1912  NA 134*  K 3.2*  CL 96*  CO2 24  GLUCOSE 221*  BUN 16  CREATININE 1.30*  CALCIUM  9.1   GFR: CrCl cannot be calculated (Unknown ideal weight.). Liver Function Tests: Recent Labs  Lab 07/31/24 1937  AST 24  ALT 13  ALKPHOS 40  BILITOT 0.6  PROT 7.3  ALBUMIN 3.6   No results for input(s): LIPASE, AMYLASE in the last 168 hours. No results for input(s): AMMONIA in the last 168 hours. Coagulation Profile: Recent Labs  Lab 07/31/24 1937  INR 1.3*   Cardiac Enzymes: No results for input(s): CKTOTAL, CKMB, CKMBINDEX, TROPONINI in the last 168 hours. BNP (last 3 results) No results for input(s): PROBNP in the last 8760 hours. HbA1C: No results  for input(s): HGBA1C in the last 72 hours. CBG: No results for input(s): GLUCAP in the last 168 hours. Lipid Profile: No results for input(s): CHOL, HDL, LDLCALC, TRIG, CHOLHDL, LDLDIRECT in the last 72 hours. Thyroid Function Tests: No results for input(s): TSH, T4TOTAL, FREET4, T3FREE, THYROIDAB in the last 72 hours. Anemia Panel: No results for input(s): VITAMINB12, FOLATE, FERRITIN, TIBC, IRON, RETICCTPCT in the last 72 hours. Urine analysis: No results found for: COLORURINE, APPEARANCEUR, LABSPEC, PHURINE, GLUCOSEU, HGBUR, BILIRUBINUR, KETONESUR, PROTEINUR, UROBILINOGEN, NITRITE, LEUKOCYTESUR  Radiological Exams on Admission: CT CHEST ABDOMEN PELVIS WO CONTRAST Result Date: 07/31/2024 CLINICAL DATA:  Generalized weakness seen and nausea with hypoxia, initial encounter EXAM: CT CHEST, ABDOMEN AND PELVIS WITHOUT CONTRAST TECHNIQUE: Multidetector CT imaging of the chest, abdomen and pelvis was performed following the standard protocol without IV contrast. RADIATION DOSE REDUCTION: This exam was performed according to the departmental dose-optimization program which includes automated exposure control, adjustment of the mA and/or kV according to patient size and/or use of iterative reconstruction technique. COMPARISON:  Chest x-ray from earlier in the same day. FINDINGS: CT CHEST FINDINGS Cardiovascular: Somewhat limited due to lack of IV contrast. Atherosclerotic calcifications of the thoracic aorta are noted. Mild focal dilatation of the proximal descending thoracic aorta to 3.6 cm is noted with almost immediate tapering in the distal descending aorta. Heart is not significantly enlarged in size. Coronary calcifications are noted. Pulmonary artery appears within normal limits. Mediastinum/Nodes: Thoracic inlet is within normal limits. The esophagus as visualized is within normal limits. No hilar or mediastinal adenopathy is noted.  Lungs/Pleura: Lungs are well aerated bilaterally. Scarring is seen in the right middle lobe superiorly. Small right lower lobe pulmonary nodule is noted measuring less than 4 mm best seen on image number 111 of series 4. Scarring in the right lung base is noted. Calcified pleural plaques are noted on the right. Musculoskeletal: Old rib fractures are seen on the right. CT ABDOMEN PELVIS FINDINGS Hepatobiliary: No focal liver abnormality is seen. Status post cholecystectomy. No biliary dilatation. Pancreas: Unremarkable. No pancreatic ductal dilatation or surrounding inflammatory changes. Spleen: Normal in size without focal abnormality. Adrenals/Urinary Tract: Adrenal glands are within normal limits. Kidneys are well visualized bilaterally. No renal calculi or  obstructive changes are seen. Right ureter is within normal limits. Left ureter demonstrates distal nonobstructing ureteral stones measuring up to 6 mm. This may represent 2 small adjacent stones. The bladder is within normal limits. Stomach/Bowel: No obstructive or inflammatory changes of the colon are seen. Postsurgical changes in the right colon noted consistent with prior surgical history. The appendix has been surgically removed. No small bowel dilatation is seen. Stomach is unremarkable. Vascular/Lymphatic: Atherosclerotic calcifications are noted. Normal tapering of the abdominal aorta is seen. Bi-iliac stenting is noted. No lymphadenopathy is noted. Reproductive: Uterus and bilateral adnexa are unremarkable. Other: No abdominal wall hernia or abnormality. No abdominopelvic ascites. Musculoskeletal: No acute or significant osseous findings. IMPRESSION: CT of the chest: Chronic changes in the right lung with scarring and calcified pleural plaques. Right solid pulmonary nodule measuring 3 mm. Per Fleischner Society Guidelines, no routine follow-up imaging is recommended. These guidelines do not apply to immunocompromised patients and patients with cancer.  Follow up in patients with significant comorbidities as clinically warranted. For lung cancer screening, adhere to Lung-RADS guidelines. Reference: Radiology. 2017; 284(1):228-43. Focal dilatation of the distal aortic arch with almost immediate tapering. CT of the abdomen and pelvis: Distal left ureteral stones without obstructive change. This measures up to 6 mm and may represent 2 adjacent stones. Postsurgical changes in the right colon. Electronically Signed   By: Oneil Devonshire M.D.   On: 07/31/2024 21:04   DG Chest Port 1 View Result Date: 07/31/2024 CLINICAL DATA:  Shortness of breath. Generalized weakness, nausea, and heart palpitations. Fever. EXAM: PORTABLE CHEST 1 VIEW COMPARISON:  None Available. FINDINGS: Heart size and pulmonary vascularity are normal for technique. Emphysematous changes in the lungs. Patchy alveolar and interstitial infiltrates seen in the lung bases may represent edema or pneumonia. Aspiration less likely. No pleural effusion or pneumothorax. Mediastinal contours appear intact. Calcification of the aorta. Degenerative changes in the spine. IMPRESSION: Patchy infiltrates in the lung bases possibly edema or pneumonia. Electronically Signed   By: Elsie Gravely M.D.   On: 07/31/2024 19:31   Data Reviewed for HPI: Relevant notes from primary care and specialist visits, past discharge summaries as available in EHR, including Care Everywhere. Prior diagnostic testing as pertinent to current admission diagnoses Updated medications and problem lists for reconciliation ED course, including vitals, labs, imaging, treatment and response to treatment Triage notes, nursing and pharmacy notes and ED provider's notes Notable results as noted above in HPI      Assessment and Plan: * Sepsis due to pneumonia (HCC) Acute respiratory failure with hypoxia Sepsis criteria include tachycardia and hypoxia with leukocytosis and lactic acidosis and respiratory failure O2 sats 88% on  arrival of EMS, now requiring 4 L to maintain sats in the 90s IV fluids with monitoring for fluid overload Rocephin and azithromycin Supplemental oxygen and wean as tolerated  Elevated troponin No history of CAD Suspect demand ischemia as patient denies chest pain and EKG is nonacute Continue to trend Continuous cardiac monitoring  Chronic heart failure with preserved ejection fraction (HFpEF) (HCC) BNP elevated at 243 but patient appears clinically euvolemic Monitor for fluid overload with sepsis fluids Given slightly elevated blood pressure will continue home GDMT with metoprolol, spironolactone, losartan and torsemide Daily weights with intake and output monitoring  Hyperglycemia Blood sugar 221 with no prior history of diabetes We will get a hemoglobin A1c  Electrolyte abnormality Mild hypokalemia and hyponatremia Pharmacy consult to manage  IDA (iron deficiency anemia) Hemoglobin of 11.4 Had capsule endoscopy on 10/6  and had colonoscopy and endoscopy a couple months prior No acute issues suspected  Paroxysmal atrial fibrillation (HCC) Continue metoprolol and apixaban   PAD with history of vascular stents (peripheral artery disease) Continue aspirin .  Not currently on statin  Hypertension, essential Continuing home meds with close monitoring of blood pressure in the setting of sepsis diagnosis     DVT prophylaxis: apixaban  Consults: none  Advance Care Planning:   Code Status: Prior   Family Communication: none  Disposition Plan: Back to previous home environment  Severity of Illness: The appropriate patient status for this patient is OBSERVATION. Observation status is judged to be reasonable and necessary in order to provide the required intensity of service to ensure the patient's safety. The patient's presenting symptoms, physical exam findings, and initial radiographic and laboratory data in the context of their medical condition is felt to place them at  decreased risk for further clinical deterioration. Furthermore, it is anticipated that the patient will be medically stable for discharge from the hospital within 2 midnights of admission.   Author: Delayne LULLA Solian, MD 07/31/2024 10:01 PM  For on call review [email or web address].

## 2024-07-31 NOTE — Assessment & Plan Note (Signed)
 Mild hypokalemia and hyponatremia Pharmacy consult to manage

## 2024-07-31 NOTE — ED Notes (Signed)
 Pt was 88% on 2L [place] that was initiated in triage. Pt titrated to 4L [place] and 92% at this time

## 2024-07-31 NOTE — Assessment & Plan Note (Signed)
 No history of CAD Suspect demand ischemia as patient denies chest pain and EKG is nonacute Continue to trend Continuous cardiac monitoring

## 2024-07-31 NOTE — Sepsis Progress Note (Signed)
 Elink monitoring for the code sepsis protocol.    08/01/24 01:25 am- Notified provider of need to order 3rd repeat lactic acid.

## 2024-07-31 NOTE — ED Provider Notes (Addendum)
 [facility] Provider Note    Event Date/Time   First MD Initiated Contact with Patient 07/31/24 1943     (approximate)   History   Weakness, Nausea, and Palpitations   HPI  Dana Mcclure is a 78 y.o. female heart failure, p A-fib on Eliquis, CAD, hyperlipidemia iron deficiency anemia, hypertension, peripheral artery disease who presents with 24 hours of generalized weakness nausea shortness of breath palpitations and chills.  Patient does not use any oxygen at baseline.  Today she awoke unwell and called family over.  She reports a cough but denies any congestion.  Reports compliance of all of her medications.  2 days ago she had a pill study and reports ongoing left-sided abdominal discomfort since that time      Physical Exam   Triage Vital Signs: ED Triage Vitals [07/31/24 1908]  Encounter Vitals Group     BP (!) 152/73     Girls Systolic BP Percentile      Girls Diastolic BP Percentile      Boys Systolic BP Percentile      Boys Diastolic BP Percentile      Pulse Rate 95     Resp 19     Temp 98.8 F (37.1 C)     Temp src      SpO2 (!) 88 %     Weight      Height      Head Circumference      Peak Flow      Pain Score 0     Pain Loc      Pain Education      Exclude from Growth Chart     Most recent vital signs: Vitals:   07/31/24 1913 07/31/24 2258  BP:  (!) 123/100  Pulse:  85  Resp:  (!) 22  Temp:  98.9 F (37.2 C)  SpO2: 93% 98%    Nursing Triage Note reviewed. Vital signs reviewed and patients oxygen saturation is hypoxic  General: Patient is well nourished, well developed, awake and alert, appears unwell, hypoxic Head: Normocephalic and atraumatic Eyes: Normal inspection, extraocular muscles intact, no conjunctival pallor Ear, nose, throat: Normal external exam Neck: Normal range of motion Respiratory: Patient is in mild respiratory distress, lungs rhonchi Cardiovascular: Patient is tachycardic, RR without murmur  appreciated GI: Abd soft, tender to palpation in left quadrant but with no guarding or rebound  Back: Normal inspection of the back with good strength and range of motion throughout all ext Extremities: pulses intact with good cap refills, no LE pitting edema or calf tenderness Neuro: The patient is alert and oriented to person, place, and time, appropriately conversive, with 5/5 bilat UE/LE strength, no gross motor or sensory defects noted. Coordination appears to be adequate. Skin: Warm, dry, and intact Psych: normal mood and affect, no SI or HI  ED Results / Procedures / Treatments   Labs (all labs ordered are listed, but only abnormal results are displayed) Labs Reviewed  BASIC METABOLIC PANEL WITH GFR - Abnormal; Notable for the following components:      Result Value   Sodium 134 (*)    Potassium 3.2 (*)    Chloride 96 (*)    Glucose, Bld 221 (*)    Creatinine, Ser 1.30 (*)    GFR, Estimated 42 (*)    All other components within normal limits  CBC - Abnormal; Notable for the following components:   WBC 22.6 (*)    Hemoglobin 11.4 (*)  HCT 34.4 (*)    RDW 16.9 (*)    Platelets 401 (*)    All other components within normal limits  PROTIME-INR - Abnormal; Notable for the following components:   Prothrombin Time 16.7 (*)    INR 1.3 (*)    All other components within normal limits  BRAIN NATRIURETIC PEPTIDE - Abnormal; Notable for the following components:   B Natriuretic Peptide 243.9 (*)    All other components within normal limits  LACTIC ACID, PLASMA - Abnormal; Notable for the following components:   Lactic Acid, Venous 2.7 (*)    All other components within normal limits  LACTIC ACID, PLASMA - Abnormal; Notable for the following components:   Lactic Acid, Venous 2.4 (*)    All other components within normal limits  URINALYSIS, W/ REFLEX TO CULTURE (INFECTION SUSPECTED) - Abnormal; Notable for the following components:   Color, Urine YELLOW (*)    APPearance HAZY  (*)    Hgb urine dipstick SMALL (*)    Protein, ur 30 (*)    Nitrite POSITIVE (*)    Bacteria, UA MANY (*)    All other components within normal limits  BLOOD GAS, VENOUS - Abnormal; Notable for the following components:   pH, Ven 7.44 (*)    pCO2, Ven 39 (*)    pO2, Ven 47 (*)    Acid-Base Excess 2.3 (*)    All other components within normal limits  TROPONIN I (HIGH SENSITIVITY) - Abnormal; Notable for the following components:   Troponin I (High Sensitivity) 44 (*)    All other components within normal limits  TROPONIN I (HIGH SENSITIVITY) - Abnormal; Notable for the following components:   Troponin I (High Sensitivity) 44 (*)    All other components within normal limits  RESP PANEL BY RT-PCR (RSV, FLU A&B, COVID)  [id number]  CULTURE, BLOOD (ROUTINE X 2)  CULTURE, BLOOD (ROUTINE X 2)  URINE CULTURE  HEPATIC FUNCTION PANEL  CBC  BASIC METABOLIC PANEL WITH GFR  HEMOGLOBIN A1C     EKG EKG and rhythm strip are interpreted by myself:   EKG: [Normal sinus rhythm] at heart rate of 94, normal QRS duration, QTc [457, nonspecific ST segments and T waves no ectopy EKG not consistent with Acute STEMI Rhythm strip: NSR in lead II   RADIOLOGY CT chest abdomen pelvis without contrast: No bowel abnormality, no acute pulmonary abnormality  chest x-ray: Concerning for pneumonia on my independent review interpretation   PROCEDURES:  Critical Care performed: Yes, see critical care procedure note(s)  .Critical Care  Performed by: Nicholaus Rolland BRAVO, MD Authorized by: Nicholaus Rolland BRAVO, MD   Critical care provider statement:    Critical care time (minutes):  35   Critical care was necessary to treat or prevent imminent or life-threatening deterioration of the following conditions:  Sepsis   Critical care was time spent personally by me on the following activities:  Development of treatment plan with patient or surrogate, discussions with consultants, evaluation of patient's response to  treatment, examination of patient, ordering and review of laboratory studies, ordering and review of radiographic studies, ordering and performing treatments and interventions, pulse oximetry, re-evaluation of patient's condition and review of old charts   Care discussed with: admitting provider   Comments:     Need for broad-spectrum antibiotics    MEDICATIONS ORDERED IN ED: Medications  lactated ringers  infusion (0 mLs Intravenous Stopped 07/31/24 2314)  losartan (COZAAR) tablet 50 mg (has no administration in time range)  metoprolol tartrate (LOPRESSOR) tablet 25 mg (25 mg Oral Given 07/31/24 2258)  spironolactone (ALDACTONE) tablet 25 mg (has no administration in time range)  apixaban (ELIQUIS) tablet 2.5 mg (2.5 mg Oral Given 07/31/24 2258)  lactated ringers  infusion (150 mL/hr Intravenous New Bag/Given 07/31/24 2301)  cefTRIAXone (ROCEPHIN) 2 g in sodium chloride  0.9 % 100 mL IVPB (has no administration in time range)  azithromycin (ZITHROMAX) 500 mg in sodium chloride  0.9 % 250 mL IVPB (has no administration in time range)  acetaminophen  (TYLENOL ) tablet 650 mg (has no administration in time range)    Or  acetaminophen  (TYLENOL ) suppository 650 mg (has no administration in time range)  HYDROcodone-acetaminophen  (NORCO/VICODIN) 5-325 MG per tablet 1-2 tablet (has no administration in time range)  ondansetron  (ZOFRAN ) tablet 4 mg (has no administration in time range)    Or  ondansetron  (ZOFRAN ) injection 4 mg (has no administration in time range)  guaiFENesin (MUCINEX) 12 hr tablet 600 mg (600 mg Oral Given 07/31/24 2259)  albuterol (PROVENTIL) (2.5 MG/3ML) 0.083% nebulizer solution 2.5 mg (has no administration in time range)  potassium chloride 10 mEq in 100 mL IVPB (10 mEq Intravenous New Bag/Given 07/31/24 2302)  cefTRIAXone (ROCEPHIN) 2 g in sodium chloride  0.9 % 100 mL IVPB (0 g Intravenous Stopped 07/31/24 2103)  azithromycin (ZITHROMAX) 500 mg in sodium chloride  0.9 % 250 mL IVPB (0  mg Intravenous Stopped 07/31/24 2203)     IMPRESSION / MDM / ASSESSMENT AND PLAN / ED COURSE                                Differential diagnosis includes, but is not limited to, sepsis, pneumonia, CHF, colitis, bowel perforation, electrolyte derangement anemia  ED course: Patient arrives hypoxic and febrile with tachycardia meeting sepsis criteria.  She does not use any oxygen at baseline.  She did have a profound leukocytosis and broad-spectrum antibiotics was directed at respiratory pathogens.  She was given gentle fluids given concern for fluid overload.  BNP was mildly elevated.  EKG demonstrated no evidence of acute ischemia and troponin was elevated which I suspect is secondary to BNP and reactive.  She had no acidosis.  She was negative on her RSV COVID swab.  Case discussed with hospitalist for admission   Clinical Course as of 07/31/24 2342  Thu Jul 31, 2024  2149 Case discussed with hospitalist for admission [HD]    Clinical Course User Index [HD] Nicholaus Rolland BRAVO, MD   -- Risk: 5 This patient has a high risk of morbidity due to further diagnostic testing or treatment. Rationale: This patient's evaluation and management involve a high risk of morbidity due to the potential severity of presenting symptoms, need for diagnostic testing, and/or initiation of treatment that may require close monitoring. The differential includes conditions with potential for significant deterioration or requiring escalation of care. Treatment decisions in the ED, including medication administration, procedural interventions, or disposition planning, reflect this level of risk. COPA: 5 The patient has the following acute or chronic illness/injury that poses a possible threat to life or bodily function: [X] : The patient has a potentially serious acute condition or an acute exacerbation of a chronic illness requiring urgent evaluation and management in the Emergency Department. The clinical presentation  necessitates immediate consideration of life-threatening or function-threatening diagnoses, even if they are ultimately ruled out.   FINAL CLINICAL IMPRESSION(S) / ED DIAGNOSES   Final diagnoses:  Sepsis, due to unspecified  organism, unspecified whether acute organ dysfunction present (HCC)  Hypoxia  Pneumonia due to infectious organism, unspecified laterality, unspecified part of lung     Rx / DC Orders   ED Discharge Orders     None        Note:  This document was prepared using Dragon voice recognition software and may include unintentional dictation errors.   Nicholaus Rolland BRAVO, MD 07/31/24 [id number]    Nicholaus Rolland BRAVO, MD 07/31/24 [phone]

## 2024-07-31 NOTE — Assessment & Plan Note (Signed)
 BNP elevated at 243 but patient appears clinically euvolemic Monitor for fluid overload with sepsis fluids Given slightly elevated blood pressure will continue home GDMT with metoprolol, spironolactone, losartan and torsemide Daily weights with intake and output monitoring

## 2024-07-31 NOTE — Assessment & Plan Note (Signed)
 Blood sugar 221 with no prior history of diabetes We will get a hemoglobin A1c

## 2024-07-31 NOTE — Code Documentation (Signed)
 CODE SEPSIS - PHARMACY COMMUNICATION  **Broad Spectrum Antibiotics should be administered within 1 hour of Sepsis diagnosis**  Time Code Sepsis Called/Page Received: 2003  Antibiotics Ordered: azithromycin and ceftriaxone  Time of 1st antibiotic administration: 2022  Additional action taken by pharmacy: none required  If necessary, Name of Provider/Nurse Contacted: N/A    Adriana JONETTA Bolster ,PharmD Clinical Pharmacist  07/31/2024  8:06 PM

## 2024-07-31 NOTE — ED Triage Notes (Signed)
 Pt arrives via ems from home c/o generalized weakness, nausea, and intermittent heart palpitations since last night. Pt had hx of afib. Ems reported pt had fever of 100.7.SABRA upon arrival to ER pt was afebrile, 98.8. no medications given en route. Pt 88% on RA, 2L [place] placed during triage, pt reports some sob and denies CP.

## 2024-08-01 ENCOUNTER — Observation Stay

## 2024-08-01 DIAGNOSIS — E872 Acidosis, unspecified: Secondary | ICD-10-CM | POA: Diagnosis present

## 2024-08-01 DIAGNOSIS — Z7902 Long term (current) use of antithrombotics/antiplatelets: Secondary | ICD-10-CM | POA: Diagnosis not present

## 2024-08-01 DIAGNOSIS — E871 Hypo-osmolality and hyponatremia: Secondary | ICD-10-CM | POA: Diagnosis present

## 2024-08-01 DIAGNOSIS — E1122 Type 2 diabetes mellitus with diabetic chronic kidney disease: Secondary | ICD-10-CM | POA: Diagnosis present

## 2024-08-01 DIAGNOSIS — N1832 Chronic kidney disease, stage 3b: Secondary | ICD-10-CM | POA: Insufficient documentation

## 2024-08-01 DIAGNOSIS — R6520 Severe sepsis without septic shock: Secondary | ICD-10-CM | POA: Diagnosis present

## 2024-08-01 DIAGNOSIS — I130 Hypertensive heart and chronic kidney disease with heart failure and stage 1 through stage 4 chronic kidney disease, or unspecified chronic kidney disease: Secondary | ICD-10-CM | POA: Diagnosis present

## 2024-08-01 DIAGNOSIS — I2510 Atherosclerotic heart disease of native coronary artery without angina pectoris: Secondary | ICD-10-CM | POA: Diagnosis present

## 2024-08-01 DIAGNOSIS — G9341 Metabolic encephalopathy: Secondary | ICD-10-CM | POA: Diagnosis not present

## 2024-08-01 DIAGNOSIS — N201 Calculus of ureter: Secondary | ICD-10-CM | POA: Diagnosis present

## 2024-08-01 DIAGNOSIS — R0902 Hypoxemia: Secondary | ICD-10-CM | POA: Diagnosis present

## 2024-08-01 DIAGNOSIS — D509 Iron deficiency anemia, unspecified: Secondary | ICD-10-CM | POA: Diagnosis present

## 2024-08-01 DIAGNOSIS — I480 Paroxysmal atrial fibrillation: Secondary | ICD-10-CM | POA: Diagnosis present

## 2024-08-01 DIAGNOSIS — B9689 Other specified bacterial agents as the cause of diseases classified elsewhere: Secondary | ICD-10-CM | POA: Diagnosis present

## 2024-08-01 DIAGNOSIS — Z7901 Long term (current) use of anticoagulants: Secondary | ICD-10-CM | POA: Diagnosis not present

## 2024-08-01 DIAGNOSIS — Z95820 Peripheral vascular angioplasty status with implants and grafts: Secondary | ICD-10-CM | POA: Diagnosis not present

## 2024-08-01 DIAGNOSIS — E1165 Type 2 diabetes mellitus with hyperglycemia: Secondary | ICD-10-CM | POA: Diagnosis present

## 2024-08-01 DIAGNOSIS — E876 Hypokalemia: Secondary | ICD-10-CM | POA: Diagnosis present

## 2024-08-01 DIAGNOSIS — E669 Obesity, unspecified: Secondary | ICD-10-CM | POA: Diagnosis present

## 2024-08-01 DIAGNOSIS — E1151 Type 2 diabetes mellitus with diabetic peripheral angiopathy without gangrene: Secondary | ICD-10-CM | POA: Diagnosis present

## 2024-08-01 DIAGNOSIS — J189 Pneumonia, unspecified organism: Secondary | ICD-10-CM | POA: Diagnosis present

## 2024-08-01 DIAGNOSIS — J9601 Acute respiratory failure with hypoxia: Secondary | ICD-10-CM | POA: Diagnosis present

## 2024-08-01 DIAGNOSIS — A419 Sepsis, unspecified organism: Secondary | ICD-10-CM | POA: Diagnosis present

## 2024-08-01 DIAGNOSIS — I5032 Chronic diastolic (congestive) heart failure: Secondary | ICD-10-CM | POA: Diagnosis present

## 2024-08-01 DIAGNOSIS — E785 Hyperlipidemia, unspecified: Secondary | ICD-10-CM | POA: Diagnosis present

## 2024-08-01 LAB — BLOOD CULTURE ID PANEL (REFLEXED) - BCID2

## 2024-08-01 LAB — GASTROINTESTINAL PANEL BY PCR, STOOL (REPLACES STOOL CULTURE)

## 2024-08-01 LAB — GLUCOSE, CAPILLARY
Glucose-Capillary: 177 mg/dL — ABNORMAL HIGH (ref 70–99)
Glucose-Capillary: 179 mg/dL — ABNORMAL HIGH (ref 70–99)
Glucose-Capillary: 181 mg/dL — ABNORMAL HIGH (ref 70–99)

## 2024-08-01 LAB — RESPIRATORY PANEL BY PCR

## 2024-08-01 LAB — CBC
HCT: 31.3 % — ABNORMAL LOW (ref 36.0–46.0)
Hemoglobin: 10.3 g/dL — ABNORMAL LOW (ref 12.0–15.0)
MCH: 28.5 pg (ref 26.0–34.0)
MCHC: 32.9 g/dL (ref 30.0–36.0)
MCV: 86.7 fL (ref 80.0–100.0)
Platelets: 357 K/uL (ref 150–400)
RBC: 3.61 MIL/uL — ABNORMAL LOW (ref 3.87–5.11)
RDW: 17.1 % — ABNORMAL HIGH (ref 11.5–15.5)
WBC: 20.9 K/uL — ABNORMAL HIGH (ref 4.0–10.5)
nRBC: 0 % (ref 0.0–0.2)

## 2024-08-01 LAB — C DIFFICILE QUICK SCREEN W PCR REFLEX
C Diff antigen: NEGATIVE
C Diff interpretation: NOT DETECTED
C Diff toxin: NEGATIVE

## 2024-08-01 LAB — BASIC METABOLIC PANEL WITH GFR
Anion gap: 11 (ref 5–15)
BUN: 14 mg/dL (ref 8–23)
CO2: 26 mmol/L (ref 22–32)
Calcium: 8.9 mg/dL (ref 8.9–10.3)
Chloride: 100 mmol/L (ref 98–111)
Creatinine, Ser: 1.28 mg/dL — ABNORMAL HIGH (ref 0.44–1.00)
GFR, Estimated: 43 mL/min — ABNORMAL LOW (ref >60)
Glucose, Bld: 181 mg/dL — ABNORMAL HIGH (ref 70–99)
Potassium: 3.7 mmol/L (ref 3.5–5.1)
Sodium: 137 mmol/L (ref 135–145)

## 2024-08-01 LAB — HEMOGLOBIN A1C
Hgb A1c MFr Bld: 7.4 % — ABNORMAL HIGH (ref 4.8–5.6)
Mean Plasma Glucose: 165.68 mg/dL

## 2024-08-01 LAB — LACTIC ACID, PLASMA: Lactic Acid, Venous: 2.1 mmol/L (ref 0.5–1.9)

## 2024-08-01 LAB — PROCALCITONIN: Procalcitonin: 4.05 ng/mL

## 2024-08-01 LAB — D-DIMER, QUANTITATIVE: D-Dimer, Quant: 0.63 ug{FEU}/mL — ABNORMAL HIGH (ref 0.00–0.50)

## 2024-08-01 LAB — CBG MONITORING, ED: Glucose-Capillary: 213 mg/dL — ABNORMAL HIGH (ref 70–99)

## 2024-08-01 MED ORDER — INSULIN ASPART 100 UNIT/ML IJ SOLN
0.0000 [IU] | Freq: Every day | INTRAMUSCULAR | Status: DC
  Administered 2024-08-02: 3 [IU] via SUBCUTANEOUS
  Administered 2024-08-03: 2 [IU] via SUBCUTANEOUS
  Filled 2024-08-01 (×2): qty 1

## 2024-08-01 MED ORDER — ACETAMINOPHEN 650 MG RE SUPP
650.0000 mg | Freq: Four times a day (QID) | RECTAL | Status: DC | PRN
  Filled 2024-08-01: qty 1

## 2024-08-01 MED ORDER — SODIUM CHLORIDE 0.9 % IV SOLN
2.0000 g | Freq: Two times a day (BID) | INTRAVENOUS | Status: DC
  Administered 2024-08-01 – 2024-08-04 (×7): 2 g via INTRAVENOUS
  Filled 2024-08-01 (×8): qty 12.5

## 2024-08-01 MED ORDER — ACETAMINOPHEN 325 MG PO TABS
975.0000 mg | ORAL_TABLET | Freq: Four times a day (QID) | ORAL | Status: DC | PRN
  Administered 2024-08-01 – 2024-08-03 (×6): 975 mg via ORAL
  Filled 2024-08-01 (×6): qty 3

## 2024-08-01 MED ORDER — INSULIN ASPART 100 UNIT/ML IJ SOLN
0.0000 [IU] | Freq: Three times a day (TID) | INTRAMUSCULAR | Status: DC
  Administered 2024-08-01: 3 [IU] via SUBCUTANEOUS
  Administered 2024-08-01: 5 [IU] via SUBCUTANEOUS
  Administered 2024-08-02: 3 [IU] via SUBCUTANEOUS
  Administered 2024-08-02: 5 [IU] via SUBCUTANEOUS
  Administered 2024-08-02 – 2024-08-03 (×2): 3 [IU] via SUBCUTANEOUS
  Administered 2024-08-03 (×2): 5 [IU] via SUBCUTANEOUS
  Administered 2024-08-04: 3 [IU] via SUBCUTANEOUS
  Filled 2024-08-01 (×8): qty 1
  Filled 2024-08-01: qty 5
  Filled 2024-08-01: qty 1

## 2024-08-01 NOTE — Progress Notes (Signed)
 PROGRESS NOTE    Dana Mcclure  FMW:[id number] DOB: 10/01/1946 DOA: 07/31/2024 PCP: [facility]     Brief Narrative:   Dana Mcclure is a 78 y.o. female with medical history significant for PAD s/p stents in BLE, HFpEF, HTN, with most recent admission in January 2025 with pneumonia during which she was briefly in A-fib, currently on Eliquis, being admitted with sepsis secondary to multifocal pneumonia.  Patient had a recent bowel prep for capsule endoscopy on 10/6 and was doing well until  last night when she developed shortness of breath, heart palpitations and weakness.  She denied cough, fever or chills or chest pain.  With EMS O2 sat was 88% on room air, arriving on 2 L. In the ED O2 sat 88% on room air requiring up to 4 L to maintain sats in the low to mid 90s Labs notable for WBC 22,000 with lactic acid 2.7 VBG with venous pH 7.44 and pCO2 39, normal bicarb Respiratory viral panel negative Troponin 44 and BNP 243 Hemoglobin 11 BMP notable for glucose 221 with minor electrolyte abnormalities of sodium 134 potassium 3.2.  Creatinine 1.3 which is above baseline EKG showing sinus at 94 with nonspecific ST-T wave changes Chest x-ray showing patchy infiltrates possibly edema or pneumonia CT chest abdomen and pelvis without contrast showing chronic findings Patient was treated with an LR bolus started on Rocephin and azithromycin   Assessment & Plan:   Principal Problem:   Sepsis due to pneumonia [facility]) Active Problems:   Acute respiratory failure with hypoxia (HCC)   Elevated troponin   Chronic heart failure with preserved ejection fraction (HFpEF) (HCC)   Hyperglycemia   History of colon cancer   Hypertension, essential   Tobacco abuse   PAD with history of vascular stents (peripheral artery disease)   Paroxysmal atrial fibrillation (HCC)   IDA (iron deficiency anemia)   Electrolyte abnormality   Type 2 diabetes mellitus without complication, without long-term  current use of insulin (HCC)   CKD stage 3b, GFR 30-44 ml/min (HCC)  # CAP Confusion and cough and hypoxia and leukocytosis, CT though more suggestive of chronic changes - continue azithromycin/ceftriaxone - f/u sputum for culture if able to produce - rvp - monitor blood cultures - f/u procal, dimer  # Acute hypoxic respiratory failure To upper 80s - 2 L [place] O2, wean as able  # Sepsis By leukocytosis, tachycardia. Likely 2/2 cap as above - continue gentle fluids  # Metabolic encephalopathy Likely 2/2 above - treat underlying issues - ct head  # Pyuria Asymptomatic - follow culture - continue ceftriaxone as above  # Diarrhea Per report. Could actually be etiology of patient's symptoms. Did have colon prep several days ago so that also could be etiology - c diff and gi pathogen panel if diarrhea here  # Left distal ureteral stones On CT, not obstructing, patient denies pain - outpt urology f/u  # Paroxysmal a fib Appears to be in sinus right now - home apixaban  # HTN Bp moderately elevated - cont home metop - hold home losart, spiro  # HFpef Appears euvolemic - gentle fluids today - holding home spiro - cont home metop  # Obesity Noted  # CKD 3b Kidney function at baseline  # T2DM Euglycemic - SSI  DVT prophylaxis: apixaban Code Status: full Family Communication: daughter telephonically 10/10  Level of care: Progressive Status is: Observation    Consultants:  none  Procedures: none  Antimicrobials:  See above  Subjective: Denies pain, endorses some cough, no dysuria, no flank pain  Objective: Vitals:   07/31/24 1913 07/31/24 2258 08/01/24 0034 08/01/24 0535  BP:  (!) 123/100 98/75 (!) 155/59  Pulse:  85 81 96  Resp:  (!) 22 18 (!) 30  Temp:  98.9 F (37.2 C) 99.4 F (37.4 C) 99.5 F (37.5 C)  TempSrc:  Oral Oral Oral  SpO2: 93% 98% 98% 95%    Intake/Output Summary (Last 24 hours) at 08/01/2024 0857 Last data filed at  08/01/2024 0243 Gross per 24 hour  Intake 982.5 ml  Output 300 ml  Net 682.5 ml   There were no vitals filed for this visit.  Examination:  General exam: Appears calm and comfortable, disheveled Respiratory system: rales at bases, scattered rhonchi Cardiovascular system: S1 & S2 heard, RRR.   Gastrointestinal system: Abdomen is nondistended, soft and nontender.   Central nervous system: moving all 4, mild confusion Extremities: Symmetric 5 x 5 power. Skin: No rashes, lesions or ulcers Psychiatry: bit confused    Data Reviewed: I have personally reviewed following labs and imaging studies  CBC: Recent Labs  Lab 07/31/24 1912 08/01/24 0531  WBC 22.6* 20.9*  HGB 11.4* 10.3*  HCT 34.4* 31.3*  MCV 86.4 86.7  PLT 401* 357   Basic Metabolic Panel: Recent Labs  Lab 07/31/24 1912 08/01/24 0531  NA 134* 137  K 3.2* 3.7  CL 96* 100  CO2 24 26  GLUCOSE 221* 181*  BUN 16 14  CREATININE 1.30* 1.28*  CALCIUM  9.1 8.9   GFR: CrCl cannot be calculated (Unknown ideal weight.). Liver Function Tests: Recent Labs  Lab 07/31/24 1937  AST 24  ALT 13  ALKPHOS 40  BILITOT 0.6  PROT 7.3  ALBUMIN 3.6   No results for input(s): LIPASE, AMYLASE in the last 168 hours. No results for input(s): AMMONIA in the last 168 hours. Coagulation Profile: Recent Labs  Lab 07/31/24 1937  INR 1.3*   Cardiac Enzymes: No results for input(s): CKTOTAL, CKMB, CKMBINDEX, TROPONINI in the last 168 hours. BNP (last 3 results) No results for input(s): PROBNP in the last 8760 hours. HbA1C: No results for input(s): HGBA1C in the last 72 hours. CBG: No results for input(s): GLUCAP in the last 168 hours. Lipid Profile: No results for input(s): CHOL, HDL, LDLCALC, TRIG, CHOLHDL, LDLDIRECT in the last 72 hours. Thyroid Function Tests: No results for input(s): TSH, T4TOTAL, FREET4, T3FREE, THYROIDAB in the last 72 hours. Anemia Panel: No results for  input(s): VITAMINB12, FOLATE, FERRITIN, TIBC, IRON, RETICCTPCT in the last 72 hours. Urine analysis:    Component Value Date/Time   COLORURINE YELLOW (A) 07/31/2024 2138   APPEARANCEUR HAZY (A) 07/31/2024 2138   LABSPEC 1.013 07/31/2024 2138   PHURINE 5.0 07/31/2024 2138   GLUCOSEU NEGATIVE 07/31/2024 2138   HGBUR SMALL (A) 07/31/2024 2138   BILIRUBINUR NEGATIVE 07/31/2024 2138   KETONESUR NEGATIVE 07/31/2024 2138   PROTEINUR 30 (A) 07/31/2024 2138   NITRITE POSITIVE (A) 07/31/2024 2138   LEUKOCYTESUR NEGATIVE 07/31/2024 2138   Sepsis Labs: @LABRCNTIP (procalcitonin:4,lacticidven:4)  ) Recent Results (from the past 240 hours)  Resp panel by RT-PCR (RSV, Flu A&B, Covid) Anterior Nasal Swab     Status: None   Collection Time: 07/31/24  7:11 PM   Specimen: Anterior Nasal Swab  Result Value Ref Range Status   SARS Coronavirus 2 by RT PCR NEGATIVE NEGATIVE Final    Comment: (NOTE) SARS-CoV-2 target nucleic acids are NOT DETECTED.  The SARS-CoV-2 RNA is  generally detectable in upper respiratory specimens during the acute phase of infection. The lowest concentration of SARS-CoV-2 viral copies this assay can detect is 138 copies/mL. A negative result does not preclude SARS-Cov-2 infection and should not be used as the sole basis for treatment or other patient management decisions. A negative result may occur with  improper specimen collection/handling, submission of specimen other than nasopharyngeal swab, presence of viral mutation(s) within the areas targeted by this assay, and inadequate number of viral copies(<138 copies/mL). A negative result must be combined with clinical observations, patient history, and epidemiological information. The expected result is Negative.  Fact Sheet for Patients:  BloggerCourse.com  Fact Sheet for Healthcare Providers:  SeriousBroker.it  This test is no t yet approved or cleared by  the [place]  FDA and  has been authorized for detection and/or diagnosis of SARS-CoV-2 by FDA under an Emergency Use Authorization (EUA). This EUA will remain  in effect (meaning this test can be used) for the duration of the COVID-19 declaration under Section 564(b)(1) of the Act, 21 U.S.C.section 360bbb-3(b)(1), unless the authorization is terminated  or revoked sooner.       Influenza A by PCR NEGATIVE NEGATIVE Final   Influenza B by PCR NEGATIVE NEGATIVE Final    Comment: (NOTE) The Xpert Xpress SARS-CoV-2/FLU/RSV plus assay is intended as an aid in the diagnosis of influenza from Nasopharyngeal swab specimens and should not be used as a sole basis for treatment. Nasal washings and aspirates are unacceptable for Xpert Xpress SARS-CoV-2/FLU/RSV testing.  Fact Sheet for Patients: BloggerCourse.com  Fact Sheet for Healthcare Providers: SeriousBroker.it  This test is not yet approved or cleared by the [place]  FDA and has been authorized for detection and/or diagnosis of SARS-CoV-2 by FDA under an Emergency Use Authorization (EUA). This EUA will remain in effect (meaning this test can be used) for the duration of the COVID-19 declaration under Section 564(b)(1) of the Act, 21 U.S.C. section 360bbb-3(b)(1), unless the authorization is terminated or revoked.     Resp Syncytial Virus by PCR NEGATIVE NEGATIVE Final    Comment: (NOTE) Fact Sheet for Patients: BloggerCourse.com  Fact Sheet for Healthcare Providers: SeriousBroker.it  This test is not yet approved or cleared by the [place]  FDA and has been authorized for detection and/or diagnosis of SARS-CoV-2 by FDA under an Emergency Use Authorization (EUA). This EUA will remain in effect (meaning this test can be used) for the duration of the COVID-19 declaration under Section 564(b)(1) of the Act, 21  U.S.C. section 360bbb-3(b)(1), unless the authorization is terminated or revoked.  Performed at [facility], [address] [place], [place] [address]   Blood Culture (routine x 2)     Status: None (Preliminary result)   Collection Time: 07/31/24  7:37 PM   Specimen: BLOOD  Result Value Ref Range Status   Specimen Description BLOOD RIGHT ANTECUBITAL  Final   Special Requests   Final    Blood Culture results may not be optimal due to an inadequate volume of blood received in culture bottles BOTTLES DRAWN AEROBIC AND ANAEROBIC   Culture   Final    NO GROWTH < 12 HOURS Performed at [facility], [address] [place], [place] [address]    Report Status PENDING  Incomplete  Blood Culture (routine x 2)     Status: None (Preliminary result)   Collection Time: 07/31/24  8:19 PM   Specimen: BLOOD  Result Value Ref Range Status   Specimen Description BLOOD BLOOD  LEFT HAND  Final   Special Requests   Final    Blood Culture results may not be optimal due to an inadequate volume of blood received in culture bottles BOTTLES DRAWN AEROBIC AND ANAEROBIC   Culture   Final    NO GROWTH < 12 HOURS Performed at [facility], [address] [place], [place] [address]    Report Status PENDING  Incomplete         Radiology Studies: CT CHEST ABDOMEN PELVIS WO CONTRAST Result Date: 07/31/2024 CLINICAL DATA:  Generalized weakness seen and nausea with hypoxia, initial encounter EXAM: CT CHEST, ABDOMEN AND PELVIS WITHOUT CONTRAST TECHNIQUE: Multidetector CT imaging of the chest, abdomen and pelvis was performed following the standard protocol without IV contrast. RADIATION DOSE REDUCTION: This exam was performed according to the departmental dose-optimization program which includes automated exposure control, adjustment of the mA and/or kV according to patient size and/or use of iterative reconstruction technique. COMPARISON:  Chest x-ray from earlier in the same day.  FINDINGS: CT CHEST FINDINGS Cardiovascular: Somewhat limited due to lack of IV contrast. Atherosclerotic calcifications of the thoracic aorta are noted. Mild focal dilatation of the proximal descending thoracic aorta to 3.6 cm is noted with almost immediate tapering in the distal descending aorta. Heart is not significantly enlarged in size. Coronary calcifications are noted. Pulmonary artery appears within normal limits. Mediastinum/Nodes: Thoracic inlet is within normal limits. The esophagus as visualized is within normal limits. No hilar or mediastinal adenopathy is noted. Lungs/Pleura: Lungs are well aerated bilaterally. Scarring is seen in the right middle lobe superiorly. Small right lower lobe pulmonary nodule is noted measuring less than 4 mm best seen on image number 111 of series 4. Scarring in the right lung base is noted. Calcified pleural plaques are noted on the right. Musculoskeletal: Old rib fractures are seen on the right. CT ABDOMEN PELVIS FINDINGS Hepatobiliary: No focal liver abnormality is seen. Status post cholecystectomy. No biliary dilatation. Pancreas: Unremarkable. No pancreatic ductal dilatation or surrounding inflammatory changes. Spleen: Normal in size without focal abnormality. Adrenals/Urinary Tract: Adrenal glands are within normal limits. Kidneys are well visualized bilaterally. No renal calculi or obstructive changes are seen. Right ureter is within normal limits. Left ureter demonstrates distal nonobstructing ureteral stones measuring up to 6 mm. This may represent 2 small adjacent stones. The bladder is within normal limits. Stomach/Bowel: No obstructive or inflammatory changes of the colon are seen. Postsurgical changes in the right colon noted consistent with prior surgical history. The appendix has been surgically removed. No small bowel dilatation is seen. Stomach is unremarkable. Vascular/Lymphatic: Atherosclerotic calcifications are noted. Normal tapering of the abdominal  aorta is seen. Bi-iliac stenting is noted. No lymphadenopathy is noted. Reproductive: Uterus and bilateral adnexa are unremarkable. Other: No abdominal wall hernia or abnormality. No abdominopelvic ascites. Musculoskeletal: No acute or significant osseous findings. IMPRESSION: CT of the chest: Chronic changes in the right lung with scarring and calcified pleural plaques. Right solid pulmonary nodule measuring 3 mm. Per Fleischner Society Guidelines, no routine follow-up imaging is recommended. These guidelines do not apply to immunocompromised patients and patients with cancer. Follow up in patients with significant comorbidities as clinically warranted. For lung cancer screening, adhere to Lung-RADS guidelines. Reference: Radiology. 2017; 284(1):228-43. Focal dilatation of the distal aortic arch with almost immediate tapering. CT of the abdomen and pelvis: Distal left ureteral stones without obstructive change. This measures up to 6 mm and may represent 2 adjacent stones. Postsurgical changes in the right colon. Electronically  Signed   By: Oneil Devonshire M.D.   On: 07/31/2024 21:04   DG Chest Port 1 View Result Date: 07/31/2024 CLINICAL DATA:  Shortness of breath. Generalized weakness, nausea, and heart palpitations. Fever. EXAM: PORTABLE CHEST 1 VIEW COMPARISON:  None Available. FINDINGS: Heart size and pulmonary vascularity are normal for technique. Emphysematous changes in the lungs. Patchy alveolar and interstitial infiltrates seen in the lung bases may represent edema or pneumonia. Aspiration less likely. No pleural effusion or pneumothorax. Mediastinal contours appear intact. Calcification of the aorta. Degenerative changes in the spine. IMPRESSION: Patchy infiltrates in the lung bases possibly edema or pneumonia. Electronically Signed   By: Elsie Gravely M.D.   On: 07/31/2024 19:31        Scheduled Meds:  apixaban  2.5 mg Oral BID   guaiFENesin  600 mg Oral BID   losartan  50 mg Oral Daily    metoprolol tartrate  25 mg Oral BID   spironolactone  25 mg Oral Daily   Continuous Infusions:  azithromycin     cefTRIAXone (ROCEPHIN)  IV     lactated ringers  Stopped (07/31/24 2314)   lactated ringers  150 mL/hr (07/31/24 2301)     LOS: 0 days     Devaughn KATHEE Ban, MD [facility]   If 7PM-7AM, please contact night-coverage [email or web address] Password [email or web address] 08/01/2024, 8:57 AM

## 2024-08-01 NOTE — ED Notes (Signed)
 Assumed care of pt from [facility]. Introduced self to pt. Pt states pain is 5/10. Call bell in reach. Pt in NAD at this time.

## 2024-08-01 NOTE — ED Notes (Signed)
 MD Cleatus at bedside to discuss admission with patient

## 2024-08-01 NOTE — Progress Notes (Signed)
 PHARMACY - PHYSICIAN COMMUNICATION CRITICAL VALUE ALERT - BLOOD CULTURE IDENTIFICATION (BCID)  Dana Mcclure is an 78 y.o. female who presented to [facility] on 07/31/2024 with a chief complaint of weakness, palpitations  Assessment:  10/9 blood culture with GNR, BICD detects K. Aerogenes. Concern is urinary source.  Being treated for pneumonia as well.    Name of physician (or Provider) Contacted: Dr Kandis  Current antibiotics: Ceftriaxone and azithromycin   Changes to prescribed antibiotics recommended:  Recommendations accepted by provider - change ceftriaxone to cefepime as this organism can produce AMP-C  Results for orders placed or performed during the hospital encounter of 07/31/24  Blood Culture ID Panel (Reflexed) (Collected: 07/31/2024  8:19 PM)  Result Value Ref Range   Enterococcus faecalis NOT DETECTED NOT DETECTED   Enterococcus Faecium NOT DETECTED NOT DETECTED   Listeria monocytogenes NOT DETECTED NOT DETECTED   Staphylococcus species NOT DETECTED NOT DETECTED   Staphylococcus aureus (BCID) NOT DETECTED NOT DETECTED   Staphylococcus epidermidis NOT DETECTED NOT DETECTED   Staphylococcus lugdunensis NOT DETECTED NOT DETECTED   Streptococcus species NOT DETECTED NOT DETECTED   Streptococcus agalactiae NOT DETECTED NOT DETECTED   Streptococcus pneumoniae NOT DETECTED NOT DETECTED   Streptococcus pyogenes NOT DETECTED NOT DETECTED   A.calcoaceticus-baumannii NOT DETECTED NOT DETECTED   Bacteroides fragilis NOT DETECTED NOT DETECTED   Enterobacterales DETECTED (A) NOT DETECTED   Enterobacter cloacae complex NOT DETECTED NOT DETECTED   Escherichia coli NOT DETECTED NOT DETECTED   Klebsiella aerogenes DETECTED (A) NOT DETECTED   Klebsiella oxytoca NOT DETECTED NOT DETECTED   Klebsiella pneumoniae NOT DETECTED NOT DETECTED   Proteus species NOT DETECTED NOT DETECTED   Salmonella species NOT DETECTED NOT DETECTED   Serratia marcescens NOT DETECTED NOT DETECTED    Haemophilus influenzae NOT DETECTED NOT DETECTED   Neisseria meningitidis NOT DETECTED NOT DETECTED   Pseudomonas aeruginosa NOT DETECTED NOT DETECTED   Stenotrophomonas maltophilia NOT DETECTED NOT DETECTED   Candida albicans NOT DETECTED NOT DETECTED   Candida auris NOT DETECTED NOT DETECTED   Candida glabrata NOT DETECTED NOT DETECTED   Candida krusei NOT DETECTED NOT DETECTED   Candida parapsilosis NOT DETECTED NOT DETECTED   Candida tropicalis NOT DETECTED NOT DETECTED   Cryptococcus neoformans/gattii NOT DETECTED NOT DETECTED   CTX-M ESBL NOT DETECTED NOT DETECTED   Carbapenem resistance IMP NOT DETECTED NOT DETECTED   Carbapenem resistance KPC NOT DETECTED NOT DETECTED   Carbapenem resistance NDM NOT DETECTED NOT DETECTED   Carbapenem resist OXA 48 LIKE NOT DETECTED NOT DETECTED   Carbapenem resistance VIM NOT DETECTED NOT DETECTED   Celestine Slovak, PharmD, BCPS, BCIDP Work Cell: [phone] 08/01/2024 12:43 PM

## 2024-08-01 NOTE — Progress Notes (Signed)
 The patient's HR  went up sustaining in the 150s. She was given the scheduled Metoprolol and it's now fluctuating in the 110s and 120s. Notified Dr. Cleatus without any new order but to watch it. Will continue to monitor.

## 2024-08-01 NOTE — Plan of Care (Signed)

## 2024-08-01 NOTE — Progress Notes (Signed)
 PHARMACY CONSULT NOTE - FOLLOW UP  Pharmacy Consult for Electrolyte Monitoring and Replacement   Recent Labs: Potassium (mmol/L)  Date Value  08/01/2024 3.7   Calcium  (mg/dL)  Date Value  89/89/7974 8.9   Albumin (g/dL)  Date Value  89/90/7974 3.6   Sodium (mmol/L)  Date Value  08/01/2024 137    Assessment: 10/9:  K @ 1912 = 3.2  78 yo F with chief complaints generalized weakness, N&V, heart palpitations presenting with sepsis secondary to pneumonia. PMH includes PAF, colon cancer, HTN, PAD, T2DM, TIA. Patient's renal function looks to be at baseline.  Goal of Therapy:  Electrolytes WNL  Plan:  - No electrolyte replacement necessary today - Check BMP, Mg, Phos with AM labs   Will M. Lenon, PharmD, BCPS Clinical Pharmacist 08/01/2024 10:01 AM

## 2024-08-02 ENCOUNTER — Inpatient Hospital Stay

## 2024-08-02 DIAGNOSIS — A419 Sepsis, unspecified organism: Secondary | ICD-10-CM

## 2024-08-02 DIAGNOSIS — J189 Pneumonia, unspecified organism: Secondary | ICD-10-CM

## 2024-08-02 LAB — CBC
HCT: 30.1 % — ABNORMAL LOW (ref 36.0–46.0)
Hemoglobin: 9.5 g/dL — ABNORMAL LOW (ref 12.0–15.0)
MCH: 27.7 pg (ref 26.0–34.0)
MCHC: 31.6 g/dL (ref 30.0–36.0)
MCV: 87.8 fL (ref 80.0–100.0)
Platelets: 306 K/uL (ref 150–400)
RBC: 3.43 MIL/uL — ABNORMAL LOW (ref 3.87–5.11)
RDW: 17.2 % — ABNORMAL HIGH (ref 11.5–15.5)
WBC: 17.6 K/uL — ABNORMAL HIGH (ref 4.0–10.5)
nRBC: 0 % (ref 0.0–0.2)

## 2024-08-02 LAB — BASIC METABOLIC PANEL WITH GFR
Anion gap: 12 (ref 5–15)
BUN: 13 mg/dL (ref 8–23)
CO2: 23 mmol/L (ref 22–32)
Calcium: 8.8 mg/dL — ABNORMAL LOW (ref 8.9–10.3)
Chloride: 101 mmol/L (ref 98–111)
Creatinine, Ser: 1.04 mg/dL — ABNORMAL HIGH (ref 0.44–1.00)
GFR, Estimated: 55 mL/min — ABNORMAL LOW (ref >60)
Glucose, Bld: 199 mg/dL — ABNORMAL HIGH (ref 70–99)
Potassium: 3.6 mmol/L (ref 3.5–5.1)
Sodium: 136 mmol/L (ref 135–145)

## 2024-08-02 LAB — GLUCOSE, CAPILLARY
Glucose-Capillary: 171 mg/dL — ABNORMAL HIGH (ref 70–99)
Glucose-Capillary: 220 mg/dL — ABNORMAL HIGH (ref 70–99)
Glucose-Capillary: 185 mg/dL — ABNORMAL HIGH (ref 70–99)
Glucose-Capillary: 293 mg/dL — ABNORMAL HIGH (ref 70–99)

## 2024-08-02 LAB — LACTIC ACID, PLASMA: Lactic Acid, Venous: 2.7 mmol/L (ref 0.5–1.9)

## 2024-08-02 LAB — PHOSPHORUS: Phosphorus: 2.6 mg/dL (ref 2.5–4.6)

## 2024-08-02 LAB — MAGNESIUM: Magnesium: 1.9 mg/dL (ref 1.7–2.4)

## 2024-08-02 MED ORDER — INFLUENZA VAC SPLIT HIGH-DOSE 0.5 ML IM SUSY
0.5000 mL | PREFILLED_SYRINGE | INTRAMUSCULAR | Status: DC
  Filled 2024-08-02: qty 0.5

## 2024-08-02 MED ORDER — METOPROLOL TARTRATE 50 MG PO TABS
50.0000 mg | ORAL_TABLET | Freq: Two times a day (BID) | ORAL | Status: DC
Start: 2024-08-02 — End: 2024-08-03
  Administered 2024-08-02 – 2024-08-03 (×3): 50 mg via ORAL
  Filled 2024-08-02 (×3): qty 1

## 2024-08-02 MED ORDER — POTASSIUM CHLORIDE CRYS ER 20 MEQ PO TBCR
40.0000 meq | EXTENDED_RELEASE_TABLET | Freq: Once | ORAL | Status: AC
  Administered 2024-08-02: 40 meq via ORAL
  Filled 2024-08-02: qty 2

## 2024-08-02 MED ORDER — MAGNESIUM SULFATE 2 GM/50ML IV SOLN
2.0000 g | Freq: Once | INTRAVENOUS | Status: AC
  Administered 2024-08-02: 2 g via INTRAVENOUS
  Filled 2024-08-02: qty 50

## 2024-08-02 MED ORDER — ORAL CARE MOUTH RINSE: 15.0000 mL | OROMUCOSAL | Status: DC | PRN

## 2024-08-02 MED ORDER — K PHOS MONO-SOD PHOS DI & MONO 155-852-130 MG PO TABS
500.0000 mg | ORAL_TABLET | Freq: Once | ORAL | Status: AC
  Administered 2024-08-02: 500 mg via ORAL
  Filled 2024-08-02: qty 2

## 2024-08-02 MED ORDER — APIXABAN 5 MG PO TABS
5.0000 mg | ORAL_TABLET | Freq: Two times a day (BID) | ORAL | Status: DC
  Administered 2024-08-02 – 2024-08-04 (×5): 5 mg via ORAL
  Filled 2024-08-02 (×5): qty 1

## 2024-08-02 MED ORDER — METOPROLOL TARTRATE 50 MG PO TABS
50.0000 mg | ORAL_TABLET | Freq: Once | ORAL | Status: AC
  Administered 2024-08-02: 50 mg via ORAL
  Filled 2024-08-02: qty 1

## 2024-08-02 NOTE — Progress Notes (Signed)
 PHARMACY CONSULT NOTE  Pharmacy Consult for Electrolyte Monitoring and Replacement   Recent Labs: Potassium (mmol/L)  Date Value  08/02/2024 3.6   Magnesium (mg/dL)  Date Value  89/88/7974 1.9   Calcium  (mg/dL)  Date Value  89/88/7974 8.8 (L)   Albumin (g/dL)  Date Value  89/90/7974 3.6   Phosphorus (mg/dL)  Date Value  89/88/7974 2.6   Sodium (mmol/L)  Date Value  08/02/2024 136   Assessment: 78 yo F with chief complaints generalized weakness, N&V, heart palpitations presenting with sepsis secondary to pneumonia. PMH includes PAF, colon cancer, HTN, PAD, T2DM, TIA. Patient's renal function looks to be at baseline.  Goal of Therapy:  Electrolytes WNL  Plan:  --K 3.6, Kcl 40 mEq PO x 1 --Mg 1.9, magnesium sulfate 2 g IV x 1 --Phos 2.6, K Phos Neutral 500 mg PO x 1 dose --BMP is ordered daily  Marolyn KATHEE Mare 08/01/2024 10:01 AM

## 2024-08-02 NOTE — Progress Notes (Signed)
   08/02/24 0019  Spiritual Encounters  Type of Visit Initial  Care provided to: Patient  Conversation partners present during encounter Nurse  Reason for visit Code  OnCall Visit Yes   Chaplain responded to RRT code.  Patient and staff all stated no chaplain support was needed at this time.

## 2024-08-02 NOTE — Plan of Care (Signed)
  Problem: Education: Goal: Knowledge of General Education information will improve Description: Including pain rating scale, medication(s)/side effects and non-pharmacologic comfort measures Outcome: Progressing   Problem: Pain Managment: Goal: General experience of comfort will improve and/or be controlled Outcome: Progressing   Problem: Clinical Measurements: Goal: Signs and symptoms of infection will decrease Outcome: Not Progressing   Problem: Respiratory: Goal: Ability to maintain adequate ventilation will improve Outcome: Not Progressing   Problem: Activity: Goal: Risk for activity intolerance will decrease Outcome: Not Progressing

## 2024-08-02 NOTE — Progress Notes (Signed)
 PROGRESS NOTE    Dana Mcclure  FMW:[id number] DOB: 1946-07-16 DOA: 07/31/2024 PCP: [facility]     Brief Narrative:   Dana Mcclure is a 78 y.o. female with medical history significant for PAD s/p stents in BLE, HFpEF, HTN, with most recent admission in January 2025 with pneumonia during which she was briefly in A-fib, currently on Eliquis, being admitted with sepsis secondary to multifocal pneumonia.  Patient had a recent bowel prep for capsule endoscopy on 10/6 and was doing well until  last night when she developed shortness of breath, heart palpitations and weakness.  She denied cough, fever or chills or chest pain.  With EMS O2 sat was 88% on room air, arriving on 2 L. In the ED O2 sat 88% on room air requiring up to 4 L to maintain sats in the low to mid 90s Labs notable for WBC 22,000 with lactic acid 2.7 VBG with venous pH 7.44 and pCO2 39, normal bicarb Respiratory viral panel negative Troponin 44 and BNP 243 Hemoglobin 11 BMP notable for glucose 221 with minor electrolyte abnormalities of sodium 134 potassium 3.2.  Creatinine 1.3 which is above baseline EKG showing sinus at 94 with nonspecific ST-T wave changes Chest x-ray showing patchy infiltrates possibly edema or pneumonia CT chest abdomen and pelvis without contrast showing chronic findings Patient was treated with an LR bolus started on Rocephin and azithromycin   Assessment & Plan:   Principal Problem:   Sepsis due to pneumonia [facility]) Active Problems:   Acute respiratory failure with hypoxia (HCC)   Elevated troponin   Chronic heart failure with preserved ejection fraction (HFpEF) (HCC)   Hyperglycemia   History of colon cancer   Hypertension, essential   Tobacco abuse   PAD with history of vascular stents (peripheral artery disease)   Paroxysmal atrial fibrillation (HCC)   IDA (iron deficiency anemia)   Electrolyte abnormality   Type 2 diabetes mellitus without complication, without long-term  current use of insulin (HCC)   CKD stage 3b, GFR 30-44 ml/min (HCC)  # Bacteremia Anaerobic in both initial sets growing klebsiella aerogenes. Source unclear - will discuss w/ ID - continue cefepime - follow urine culture  # CAP? Confusion and cough and hypoxia and leukocytosis, CT though more suggestive of chronic changes and has bacteremia as above so this may be a red herring. Respiratory panels negative. Not producing sputum - continue cefepime as above  # Acute hypoxic respiratory failure To upper 80s on arrival, today low 90s on room air - monitor  # Sepsis By leukocytosis, tachycardia. Likely 2/2 cap as above - continue gentle fluids - trend lactate  # Metabolic encephalopathy Likely 2/2 above. Resolved - treat underlying issues  # Pyuria Asymptomatic. Non-obstructing left sided stones - follow culture - continue cefepime as above  # Diarrhea Per report.  Did have colon prep several days ago so that also could be etiology. C diff and gi pathogen panel negative  # Left distal ureteral stones On CT, not obstructing, patient denies pain - outpt urology f/u for now though may need repeat abdominal imaging if symptoms develop or if persistent fevers, etc  # Paroxysmal a fib Rvr overnight - home apixaban - increase metop to 50 bid  # HTN Bp mildly elevated - cont home metop - hold home losart, spiro  # HFpef Appears euvolemic - gentle fluids  - holding home spiro - cont home metop  # Obesity Noted  # CKD 3b Kidney function at  baseline  # T2DM Euglycemic - SSI  # Debility - PT eval  DVT prophylaxis: apixaban Code Status: full Family Communication: daughter and son at bedside 10/11  Level of care: Progressive Status is: inpt    Consultants:  none  Procedures: none  Antimicrobials:  See above    Subjective: Ongoing mild cough, feeling fatigued  Objective: Vitals:   08/02/24 0905 08/02/24 0908 08/02/24 0913 08/02/24 1000  BP:    136/86 (!) 144/112  Pulse: (!) 127 (!) 140 (!) 117 95  Resp:   (!) 30 (!) 29  Temp: 99.4 F (37.4 C)     TempSrc: Oral     SpO2:   98% 98%    Intake/Output Summary (Last 24 hours) at 08/02/2024 1035 Last data filed at 08/02/2024 0100 Gross per 24 hour  Intake 450 ml  Output 100 ml  Net 350 ml   There were no vitals filed for this visit.  Examination:  General exam: Appears calm and comfortable Respiratory system: rales at bases, scattered rhonchi Cardiovascular system: S1 & S2 heard, RRR.   Gastrointestinal system: Abdomen is nondistended, soft and nontender.   Central nervous system: moving all 4, alert and oriented Extremities: Symmetric 5 x 5 power. Skin: No rashes, lesions or ulcers Psychiatry: calm    Data Reviewed: I have personally reviewed following labs and imaging studies  CBC: Recent Labs  Lab 07/31/24 1912 08/01/24 0531 08/02/24 0347  WBC 22.6* 20.9* 17.6*  HGB 11.4* 10.3* 9.5*  HCT 34.4* 31.3* 30.1*  MCV 86.4 86.7 87.8  PLT 401* 357 306   Basic Metabolic Panel: Recent Labs  Lab 07/31/24 1912 08/01/24 0531 08/02/24 0347  NA 134* 137 136  K 3.2* 3.7 3.6  CL 96* 100 101  CO2 24 26 23   GLUCOSE 221* 181* 199*  BUN 16 14 13   CREATININE 1.30* 1.28* 1.04*  CALCIUM  9.1 8.9 8.8*  MG  --   --  1.9  PHOS  --   --  2.6   GFR: CrCl cannot be calculated (Unknown ideal weight.). Liver Function Tests: Recent Labs  Lab 07/31/24 1937  AST 24  ALT 13  ALKPHOS 40  BILITOT 0.6  PROT 7.3  ALBUMIN 3.6   No results for input(s): LIPASE, AMYLASE in the last 168 hours. No results for input(s): AMMONIA in the last 168 hours. Coagulation Profile: Recent Labs  Lab 07/31/24 1937  INR 1.3*   Cardiac Enzymes: No results for input(s): CKTOTAL, CKMB, CKMBINDEX, TROPONINI in the last 168 hours. BNP (last 3 results) No results for input(s): PROBNP in the last 8760 hours. HbA1C: Recent Labs    08/01/24 0531  HGBA1C 7.4*    CBG: Recent Labs  Lab 08/01/24 1159 08/01/24 1540 08/01/24 1556 08/01/24 2115 08/02/24 0828  GLUCAP 213* 177* 179* 181* 171*   Lipid Profile: No results for input(s): CHOL, HDL, LDLCALC, TRIG, CHOLHDL, LDLDIRECT in the last 72 hours. Thyroid Function Tests: No results for input(s): TSH, T4TOTAL, FREET4, T3FREE, THYROIDAB in the last 72 hours. Anemia Panel: No results for input(s): VITAMINB12, FOLATE, FERRITIN, TIBC, IRON, RETICCTPCT in the last 72 hours. Urine analysis:    Component Value Date/Time   COLORURINE YELLOW (A) 07/31/2024 2138   APPEARANCEUR HAZY (A) 07/31/2024 2138   LABSPEC 1.013 07/31/2024 2138   PHURINE 5.0 07/31/2024 2138   GLUCOSEU NEGATIVE 07/31/2024 2138   HGBUR SMALL (A) 07/31/2024 2138   BILIRUBINUR NEGATIVE 07/31/2024 2138   KETONESUR NEGATIVE 07/31/2024 2138   PROTEINUR 30 (A) 07/31/2024 2138  NITRITE POSITIVE (A) 07/31/2024 2138   LEUKOCYTESUR NEGATIVE 07/31/2024 2138   Sepsis Labs: @LABRCNTIP (procalcitonin:4,lacticidven:4)  ) Recent Results (from the past 240 hours)  Resp panel by RT-PCR (RSV, Flu A&B, Covid) Anterior Nasal Swab     Status: None   Collection Time: 07/31/24  7:11 PM   Specimen: Anterior Nasal Swab  Result Value Ref Range Status   SARS Coronavirus 2 by RT PCR NEGATIVE NEGATIVE Final    Comment: (NOTE) SARS-CoV-2 target nucleic acids are NOT DETECTED.  The SARS-CoV-2 RNA is generally detectable in upper respiratory specimens during the acute phase of infection. The lowest concentration of SARS-CoV-2 viral copies this assay can detect is 138 copies/mL. A negative result does not preclude SARS-Cov-2 infection and should not be used as the sole basis for treatment or other patient management decisions. A negative result may occur with  improper specimen collection/handling, submission of specimen other than nasopharyngeal swab, presence of viral mutation(s) within the areas targeted by this  assay, and inadequate number of viral copies(<138 copies/mL). A negative result must be combined with clinical observations, patient history, and epidemiological information. The expected result is Negative.  Fact Sheet for Patients:  BloggerCourse.com  Fact Sheet for Healthcare Providers:  SeriousBroker.it  This test is no t yet approved or cleared by the [place]  FDA and  has been authorized for detection and/or diagnosis of SARS-CoV-2 by FDA under an Emergency Use Authorization (EUA). This EUA will remain  in effect (meaning this test can be used) for the duration of the COVID-19 declaration under Section 564(b)(1) of the Act, 21 U.S.C.section 360bbb-3(b)(1), unless the authorization is terminated  or revoked sooner.       Influenza A by PCR NEGATIVE NEGATIVE Final   Influenza B by PCR NEGATIVE NEGATIVE Final    Comment: (NOTE) The Xpert Xpress SARS-CoV-2/FLU/RSV plus assay is intended as an aid in the diagnosis of influenza from Nasopharyngeal swab specimens and should not be used as a sole basis for treatment. Nasal washings and aspirates are unacceptable for Xpert Xpress SARS-CoV-2/FLU/RSV testing.  Fact Sheet for Patients: BloggerCourse.com  Fact Sheet for Healthcare Providers: SeriousBroker.it  This test is not yet approved or cleared by the [place]  FDA and has been authorized for detection and/or diagnosis of SARS-CoV-2 by FDA under an Emergency Use Authorization (EUA). This EUA will remain in effect (meaning this test can be used) for the duration of the COVID-19 declaration under Section 564(b)(1) of the Act, 21 U.S.C. section 360bbb-3(b)(1), unless the authorization is terminated or revoked.     Resp Syncytial Virus by PCR NEGATIVE NEGATIVE Final    Comment: (NOTE) Fact Sheet for Patients: BloggerCourse.com  Fact Sheet for  Healthcare Providers: SeriousBroker.it  This test is not yet approved or cleared by the [place]  FDA and has been authorized for detection and/or diagnosis of SARS-CoV-2 by FDA under an Emergency Use Authorization (EUA). This EUA will remain in effect (meaning this test can be used) for the duration of the COVID-19 declaration under Section 564(b)(1) of the Act, 21 U.S.C. section 360bbb-3(b)(1), unless the authorization is terminated or revoked.  Performed at [facility], [address] [place], [place] [address]   Blood Culture (routine x 2)     Status: None (Preliminary result)   Collection Time: 07/31/24  7:37 PM   Specimen: BLOOD  Result Value Ref Range Status   Specimen Description   Final    BLOOD RIGHT ANTECUBITAL Performed at [facility], [address] [place],  [place] [address]    Special Requests   Final    Blood Culture results may not be optimal due to an inadequate volume of blood received in culture bottles BOTTLES DRAWN AEROBIC AND ANAEROBIC Performed at [facility], [address], [place], [place] [address]    Culture  Setup Time   Final    GRAM NEGATIVE RODS AEROBIC BOTTLE ONLY CRITICAL VALUE NOTED.  VALUE IS CONSISTENT WITH PREVIOUSLY REPORTED AND CALLED VALUE. GRAM STAIN REVIEWED-AGREE WITH RESULT DRT Performed at [facility], [address] [place], [place] [address]    Culture GRAM NEGATIVE RODS  Final   Report Status PENDING  Incomplete  Blood Culture (routine x 2)     Status: None (Preliminary result)   Collection Time: 07/31/24  8:19 PM   Specimen: BLOOD LEFT HAND  Result Value Ref Range Status   Specimen Description   Final    BLOOD LEFT HAND Performed at [facility], [address] [place], [place] [address]    Special Requests   Final    Blood Culture results may not be optimal due to an inadequate volume of blood received in culture bottles BOTTLES DRAWN AEROBIC  AND ANAEROBIC Performed at [facility], [address] [place], [place] [address]    Culture  Setup Time   Final    Organism ID to follow GRAM NEGATIVE RODS ANAEROBIC BOTTLE ONLY CRITICAL RESULT CALLED TO, READ BACK BY AND VERIFIED WITH: RODNEY GRUBB, PHARMD AT 1153 ON 08/01/24 BY GM GRAM STAIN REVIEWED-AGREE WITH RESULT DRT Performed at [facility], [address] [place], [place] [address]    Culture GRAM NEGATIVE RODS  Final   Report Status PENDING  Incomplete  Blood Culture ID Panel (Reflexed)     Status: Abnormal   Collection Time: 07/31/24  8:19 PM  Result Value Ref Range Status   Enterococcus faecalis NOT DETECTED NOT DETECTED Final   Enterococcus Faecium NOT DETECTED NOT DETECTED Final   Listeria monocytogenes NOT DETECTED NOT DETECTED Final   Staphylococcus species NOT DETECTED NOT DETECTED Final   Staphylococcus aureus (BCID) NOT DETECTED NOT DETECTED Final   Staphylococcus epidermidis NOT DETECTED NOT DETECTED Final   Staphylococcus lugdunensis NOT DETECTED NOT DETECTED Final   Streptococcus species NOT DETECTED NOT DETECTED Final   Streptococcus agalactiae NOT DETECTED NOT DETECTED Final   Streptococcus pneumoniae NOT DETECTED NOT DETECTED Final   Streptococcus pyogenes NOT DETECTED NOT DETECTED Final   A.calcoaceticus-baumannii NOT DETECTED NOT DETECTED Final   Bacteroides fragilis NOT DETECTED NOT DETECTED Final   Enterobacterales DETECTED (A) NOT DETECTED Final    Comment: Enterobacterales represent a large order of gram negative bacteria, not a single organism. CRITICAL RESULT CALLED TO, READ BACK BY AND VERIFIED WITH: RODNEY GRUBB, PHARMD AT 1153 ON 08/01/24 BY GM    Enterobacter cloacae complex NOT DETECTED NOT DETECTED Final   Escherichia coli NOT DETECTED NOT DETECTED Final   Klebsiella aerogenes DETECTED (A) NOT DETECTED Final    Comment: CRITICAL RESULT CALLED TO, READ BACK BY AND VERIFIED WITH: RODNEY GRUBB, PHARMD AT 1153 ON 08/01/24  BY GM    Klebsiella oxytoca NOT DETECTED NOT DETECTED Final   Klebsiella pneumoniae NOT DETECTED NOT DETECTED Final   Proteus species NOT DETECTED NOT DETECTED Final   Salmonella species NOT DETECTED NOT DETECTED Final   Serratia marcescens NOT DETECTED NOT DETECTED Final   Haemophilus influenzae NOT DETECTED NOT DETECTED Final   Neisseria meningitidis NOT DETECTED  NOT DETECTED Final   Pseudomonas aeruginosa NOT DETECTED NOT DETECTED Final   Stenotrophomonas maltophilia NOT DETECTED NOT DETECTED Final   Candida albicans NOT DETECTED NOT DETECTED Final   Candida auris NOT DETECTED NOT DETECTED Final   Candida glabrata NOT DETECTED NOT DETECTED Final   Candida krusei NOT DETECTED NOT DETECTED Final   Candida parapsilosis NOT DETECTED NOT DETECTED Final   Candida tropicalis NOT DETECTED NOT DETECTED Final   Cryptococcus neoformans/gattii NOT DETECTED NOT DETECTED Final   CTX-M ESBL NOT DETECTED NOT DETECTED Final   Carbapenem resistance IMP NOT DETECTED NOT DETECTED Final   Carbapenem resistance KPC NOT DETECTED NOT DETECTED Final   Carbapenem resistance NDM NOT DETECTED NOT DETECTED Final   Carbapenem resist OXA 48 LIKE NOT DETECTED NOT DETECTED Final   Carbapenem resistance VIM NOT DETECTED NOT DETECTED Final    Comment: Performed at [facility], [address] [place], [place] [address]  Urine Culture     Status: None (Preliminary result)   Collection Time: 07/31/24  9:38 PM   Specimen: Urine, Random  Result Value Ref Range Status   Specimen Description   Final    URINE, RANDOM Performed at [facility], [address], [place], [place] [address]    Special Requests   Final    NONE Reflexed from [phone] Performed at [facility], [address] [place], [place] [address]    Culture   Final    CULTURE REINCUBATED FOR BETTER GROWTH Performed at [facility], [address], [place], [place] [address]    Report Status PENDING  Incomplete   Respiratory (~20 pathogens) panel by PCR     Status: None   Collection Time: 08/01/24 10:14 AM   Specimen: Nasopharyngeal Swab; Respiratory  Result Value Ref Range Status   Adenovirus NOT DETECTED NOT DETECTED Final   Coronavirus 229E NOT DETECTED NOT DETECTED Final    Comment: (NOTE) The Coronavirus on the Respiratory Panel, DOES NOT test for the novel  Coronavirus (2019 nCoV)    Coronavirus HKU1 NOT DETECTED NOT DETECTED Final   Coronavirus NL63 NOT DETECTED NOT DETECTED Final   Coronavirus OC43 NOT DETECTED NOT DETECTED Final   Metapneumovirus NOT DETECTED NOT DETECTED Final   Rhinovirus / Enterovirus NOT DETECTED NOT DETECTED Final   Influenza A NOT DETECTED NOT DETECTED Final   Influenza B NOT DETECTED NOT DETECTED Final   Parainfluenza Virus 1 NOT DETECTED NOT DETECTED Final   Parainfluenza Virus 2 NOT DETECTED NOT DETECTED Final   Parainfluenza Virus 3 NOT DETECTED NOT DETECTED Final   Parainfluenza Virus 4 NOT DETECTED NOT DETECTED Final   Respiratory Syncytial Virus NOT DETECTED NOT DETECTED Final   Bordetella pertussis NOT DETECTED NOT DETECTED Final   Bordetella Parapertussis NOT DETECTED NOT DETECTED Final   Chlamydophila pneumoniae NOT DETECTED NOT DETECTED Final   Mycoplasma pneumoniae NOT DETECTED NOT DETECTED Final    Comment: Performed at [facility], [address] [place] [address], [place], [place] [address]  C Difficile Quick Screen w PCR reflex     Status: None   Collection Time: 08/01/24  5:00 PM   Specimen: STOOL  Result Value Ref Range Status   C Diff antigen NEGATIVE NEGATIVE Final   C Diff toxin NEGATIVE NEGATIVE Final   C Diff interpretation No C. difficile detected.  Final    Comment: Performed at [facility], [address] [place], [place] [address]  Gastrointestinal Panel by PCR , Stool  Status: None   Collection Time: 08/01/24  5:00 PM   Specimen: Stool  Result Value Ref Range Status   Campylobacter species NOT DETECTED NOT DETECTED Final    Plesimonas shigelloides NOT DETECTED NOT DETECTED Final   Salmonella species NOT DETECTED NOT DETECTED Final   Yersinia enterocolitica NOT DETECTED NOT DETECTED Final   Vibrio species NOT DETECTED NOT DETECTED Final   Vibrio cholerae NOT DETECTED NOT DETECTED Final   Enteroaggregative E coli (EAEC) NOT DETECTED NOT DETECTED Final   Enteropathogenic E coli (EPEC) NOT DETECTED NOT DETECTED Final   Enterotoxigenic E coli (ETEC) NOT DETECTED NOT DETECTED Final   Shiga like toxin producing E coli (STEC) NOT DETECTED NOT DETECTED Final   Shigella/Enteroinvasive E coli (EIEC) NOT DETECTED NOT DETECTED Final   Cryptosporidium NOT DETECTED NOT DETECTED Final   Cyclospora cayetanensis NOT DETECTED NOT DETECTED Final   Entamoeba histolytica NOT DETECTED NOT DETECTED Final   Giardia lamblia NOT DETECTED NOT DETECTED Final   Adenovirus F40/41 NOT DETECTED NOT DETECTED Final   Astrovirus NOT DETECTED NOT DETECTED Final   Norovirus GI/GII NOT DETECTED NOT DETECTED Final   Rotavirus A NOT DETECTED NOT DETECTED Final   Sapovirus (I, II, IV, and V) NOT DETECTED NOT DETECTED Final    Comment: Performed at [facility], [address] [place], [place] [address]         Radiology Studies: DG Chest 1 View Result Date: 08/02/2024 CLINICAL DATA:  Difficulty breathing EXAM: PORTABLE CHEST 1 VIEW COMPARISON:  07/31/2024 FINDINGS: Cardiac shadows within normal limits. Aortic calcifications are seen. Patchy bibasilar airspace opacities are noted left greater than right consistent with early infiltrate. No sizable effusion is seen. IMPRESSION: Patchy bibasilar infiltrates. Electronically Signed   By: Oneil Devonshire M.D.   On: 08/02/2024 02:46   CT HEAD WO CONTRAST ( ) Result Date: 08/01/2024 EXAM: CT HEAD WITHOUT CONTRAST 08/01/2024 10:09:48 AM TECHNIQUE: CT of the head was performed without the administration of intravenous contrast. Automated exposure control, iterative reconstruction, and/or  weight based adjustment of the mA/kV was utilized to reduce the radiation dose to as low as reasonably achievable. COMPARISON: CT head 09/22/2011 CLINICAL HISTORY: Encephalopathy. FINDINGS: BRAIN AND VENTRICLES: There is no evidence of an acute infarct, intracranial hemorrhage, mass, midline shift, hydrocephalus, or extra-axial fluid collection. Mild cerebral atrophy with normal limits for age. The ventricles are normal in size. Periventricular white matter hypodensities are nonspecific but compatible with mild chronic small vessel ischemic disease. Calcified atherosclerosis at the skull base. No suspicious acute vascular hyperdensity. ORBITS: No acute abnormality. SINUSES: Minimal mucosal thickening in the included paranasal sinuses. Trace left mastoid fluid. SOFT TISSUES AND SKULL: No acute soft tissue abnormality. No skull fracture. IMPRESSION: 1. No acute intracranial abnormality. 2. Mild chronic small vessel ischemic disease. Electronically signed by: Dasie Hamburg MD 08/01/2024 10:16 AM EDT RP Workstation: [id number]   CT CHEST ABDOMEN PELVIS WO CONTRAST Result Date: 07/31/2024 CLINICAL DATA:  Generalized weakness seen and nausea with hypoxia, initial encounter EXAM: CT CHEST, ABDOMEN AND PELVIS WITHOUT CONTRAST TECHNIQUE: Multidetector CT imaging of the chest, abdomen and pelvis was performed following the standard protocol without IV contrast. RADIATION DOSE REDUCTION: This exam was performed according to the departmental dose-optimization program which includes automated exposure control, adjustment of the mA and/or kV according to patient size and/or use of iterative reconstruction technique. COMPARISON:  Chest x-ray from earlier in the same day. FINDINGS: CT CHEST FINDINGS Cardiovascular: Somewhat limited due to lack of IV contrast. Atherosclerotic calcifications of  the thoracic aorta are noted. Mild focal dilatation of the proximal descending thoracic aorta to 3.6 cm is noted with almost immediate  tapering in the distal descending aorta. Heart is not significantly enlarged in size. Coronary calcifications are noted. Pulmonary artery appears within normal limits. Mediastinum/Nodes: Thoracic inlet is within normal limits. The esophagus as visualized is within normal limits. No hilar or mediastinal adenopathy is noted. Lungs/Pleura: Lungs are well aerated bilaterally. Scarring is seen in the right middle lobe superiorly. Small right lower lobe pulmonary nodule is noted measuring less than 4 mm best seen on image number 111 of series 4. Scarring in the right lung base is noted. Calcified pleural plaques are noted on the right. Musculoskeletal: Old rib fractures are seen on the right. CT ABDOMEN PELVIS FINDINGS Hepatobiliary: No focal liver abnormality is seen. Status post cholecystectomy. No biliary dilatation. Pancreas: Unremarkable. No pancreatic ductal dilatation or surrounding inflammatory changes. Spleen: Normal in size without focal abnormality. Adrenals/Urinary Tract: Adrenal glands are within normal limits. Kidneys are well visualized bilaterally. No renal calculi or obstructive changes are seen. Right ureter is within normal limits. Left ureter demonstrates distal nonobstructing ureteral stones measuring up to 6 mm. This may represent 2 small adjacent stones. The bladder is within normal limits. Stomach/Bowel: No obstructive or inflammatory changes of the colon are seen. Postsurgical changes in the right colon noted consistent with prior surgical history. The appendix has been surgically removed. No small bowel dilatation is seen. Stomach is unremarkable. Vascular/Lymphatic: Atherosclerotic calcifications are noted. Normal tapering of the abdominal aorta is seen. Bi-iliac stenting is noted. No lymphadenopathy is noted. Reproductive: Uterus and bilateral adnexa are unremarkable. Other: No abdominal wall hernia or abnormality. No abdominopelvic ascites. Musculoskeletal: No acute or significant osseous  findings. IMPRESSION: CT of the chest: Chronic changes in the right lung with scarring and calcified pleural plaques. Right solid pulmonary nodule measuring 3 mm. Per Fleischner Society Guidelines, no routine follow-up imaging is recommended. These guidelines do not apply to immunocompromised patients and patients with cancer. Follow up in patients with significant comorbidities as clinically warranted. For lung cancer screening, adhere to Lung-RADS guidelines. Reference: Radiology. 2017; 284(1):228-43. Focal dilatation of the distal aortic arch with almost immediate tapering. CT of the abdomen and pelvis: Distal left ureteral stones without obstructive change. This measures up to 6 mm and may represent 2 adjacent stones. Postsurgical changes in the right colon. Electronically Signed   By: Oneil Devonshire M.D.   On: 07/31/2024 21:04   DG Chest Port 1 View Result Date: 07/31/2024 CLINICAL DATA:  Shortness of breath. Generalized weakness, nausea, and heart palpitations. Fever. EXAM: PORTABLE CHEST 1 VIEW COMPARISON:  None Available. FINDINGS: Heart size and pulmonary vascularity are normal for technique. Emphysematous changes in the lungs. Patchy alveolar and interstitial infiltrates seen in the lung bases may represent edema or pneumonia. Aspiration less likely. No pleural effusion or pneumothorax. Mediastinal contours appear intact. Calcification of the aorta. Degenerative changes in the spine. IMPRESSION: Patchy infiltrates in the lung bases possibly edema or pneumonia. Electronically Signed   By: Elsie Gravely M.D.   On: 07/31/2024 19:31        Scheduled Meds:  apixaban  5 mg Oral BID   guaiFENesin  600 mg Oral BID   insulin aspart  0-15 Units Subcutaneous TID WC   insulin aspart  0-5 Units Subcutaneous QHS   metoprolol tartrate  50 mg Oral BID   Continuous Infusions:  azithromycin 500 mg (08/01/24 2201)   ceFEPime (MAXIPIME) IV 2  g (08/01/24 2353)   magnesium sulfate bolus IVPB 2 g (08/02/24  0949)     LOS: 1 day     Devaughn KATHEE Ban, MD [facility]   If 7PM-7AM, please contact night-coverage [email or web address] Password [email or web address] 08/02/2024, 10:35 AM

## 2024-08-02 NOTE — Progress Notes (Addendum)
   08/01/24 2353  Assess: MEWS Score  Temp (!) 100.8 F (38.2 C)  BP (!) 156/95  MAP (mmHg) 113  Pulse Rate (!) 106  Resp (!) 28  Level of Consciousness Alert  SpO2 94 %  O2 Device Nasal Cannula  Patient Activity (if Appropriate) In bed  O2 Flow Rate (L/min) 2 L/min  Assess: MEWS Score  MEWS Temp 1  MEWS Systolic 0  MEWS Pulse 1  MEWS RR 2  MEWS LOC 0  MEWS Score 4  MEWS Score Color Red  Assess: if the MEWS score is Yellow or Red  Were vital signs accurate and taken at a resting state? Yes  Does the patient meet 2 or more of the SIRS criteria? Yes  Does the patient have a confirmed or suspected source of infection? Yes  MEWS guidelines implemented  Yes, red  Treat  MEWS Interventions Considered administering scheduled or prn medications/treatments as ordered  Take Vital Signs  Increase Vital Sign Frequency  Red: Q1hr x2, continue Q4hrs until patient remains green for 12hrs  Escalate  MEWS: Escalate Red: Discuss with charge nurse and notify provider. Consider notifying RRT. If remains red for 2 hours consider need for higher level of care  Notify: Charge Nurse/RN  Name of Charge Nurse/RN Notified [place] RN  Provider Notification  Provider Name/Title Dr. Cleatus  Date Provider Notified 08/02/24  Time Provider Notified 0008  Method of Notification Page  Notification Reason Change in status  Provider response At bedside  Date of Provider Response 08/02/24  Time of Provider Response 0013  Notify: Rapid Response  Name of Rapid Response RN Notified Ann RN  Date Rapid Response Notified 08/02/24  Time Rapid Response Notified 0002  Assess: SIRS CRITERIA  SIRS Temperature  0  SIRS Respirations  1  SIRS Pulse 1  SIRS WBC 0  SIRS Score Sum  2   The NT called this RN to came to the room to see the patient as her breathing was different per her statement. I went and observed the patient jerking  her head back and neck to breath as if gasping for air. The patient is A & O x 4  and is able to answer all the questions appropriately. Dr. Cleatus and rapid response  paged. Dr. Cleatus stated the patient was breathing like that last night when she admitted her. Tylenol  PRN administered. Will continue to monitor

## 2024-08-02 NOTE — Progress Notes (Signed)
     Overhead rapid response  CROSS COVER NOTE  NAME: Dana Mcclure MRN: [id number] DOB : [birth year]    Concern    Paged to come immediately to bedside due to acute change in condition.  Rapid response subsequently paged overhead  Per nurse, patient having more difficulty breathing, febrile, appears to be struggling to breathe and jerking motions of head   Pertinent findings on chart review: Patient admitted on 08/01/2024 with sepsis secondary to pneumonia  Patient Assessment Upon my arrival, rapid response team at bedside.  Patient sitting up awake and alert, jerking movements noted of head which patient states is normal for her  08/01/24 23:53:43 100.8 F (38.2 C) Abnormal  106 Abnormal  -- 28 Abnormal  156/95 Abnormal  Lying 94 % Nasal Cannula 2 L/min  08/01/24 2343 -- -- 108 Abnormal  -- -- -- -- -- --  08/01/24 2305 -- -- 111 Abnormal  -- -- -- -- -- --  08/01/24 21:40:01 -- -- -- -- -- -- -- Nasal Cannula 2 L/min  08/01/24 21:40:01 -- 140 Abnormal  -- -- 161/74 Abnormal  -- 96 % -- --  08/01/24 20:55:20 99.1 F (37.3 C) 90 -- 18 155/65 Abnormal  Lying 99 % Nasal Cannula 2 L/min   Physical Exam Vitals and nursing note reviewed.  Constitutional:      General: She is not in acute distress. HENT:     Head: Normocephalic and atraumatic.  Cardiovascular:     Rate and Rhythm: Regular rhythm. Tachycardia present.     Heart sounds: Normal heart sounds.  Pulmonary:     Effort: Tachypnea present.     Breath sounds: Normal breath sounds.  Abdominal:     Palpations: Abdomen is soft.     Tenderness: There is no abdominal tenderness.  Neurological:     Mental Status: Mental status is at baseline.      Workup Chest x-ray showing patchy infiltrates   Assessment and  Interventions   Assessment:  Sepsis secondary to pneumonia  Plan: Continue current plan X X      CRITICAL CARE Performed by: Delayne LULLA Solian   Total critical care time: 30 minutes  Critical care  time was exclusive of separately billable procedures and treating other patients.  Critical care was necessary to treat or prevent imminent or life-threatening deterioration.  Critical care was time spent personally by me on the following activities: development of treatment plan with patient and/or surrogate as well as nursing, discussions with consultants, evaluation of patient's response to treatment, examination of patient, obtaining history from patient or surrogate, ordering and performing treatments and interventions, ordering and review of laboratory studies, ordering and review of radiographic studies, pulse oximetry and re-evaluation of patient's condition.

## 2024-08-02 NOTE — Plan of Care (Signed)

## 2024-08-02 NOTE — Progress Notes (Signed)
 Rapid Response Event Note   Reason for Call :  Increased Work of Breathing  Initial Focused Assessment:  Rapid response nurse received a call about this patient who was noted to have some shortness of breath with jerking of shoulders/neck with respiration by primary RN. Rapid nurse arrived to see patient in bed, with some shortness of breath and wheezing but maintaining airway well and speaking in clear sentences. Able to answer orientation questions appropriately. HR 109, temp 100.8, SPO2 95% on 2L [place], RR ~30. Admitted with sepsis due to CAP. Dr. Cleatus in to see the patient.  Interventions:  No interventions performed by rapid response RN. See new orders placed by Dr. Cleatus.  Plan of Care:  - Administer Tylenol  to treat  low-grade fever - Chest x-ray  Event Summary:   MD Notified: Dr. Delayne Cleatus, MD Call Time: 0005 Arrival Time: 0007 End Time: 0020  Arlean FORBES Bowers, RN

## 2024-08-03 DIAGNOSIS — A419 Sepsis, unspecified organism: Secondary | ICD-10-CM | POA: Diagnosis not present

## 2024-08-03 DIAGNOSIS — J189 Pneumonia, unspecified organism: Secondary | ICD-10-CM | POA: Diagnosis not present

## 2024-08-03 LAB — CBC
HCT: 28.7 % — ABNORMAL LOW (ref 36.0–46.0)
Hemoglobin: 9.3 g/dL — ABNORMAL LOW (ref 12.0–15.0)
MCH: 28.2 pg (ref 26.0–34.0)
MCHC: 32.4 g/dL (ref 30.0–36.0)
MCV: 87 fL (ref 80.0–100.0)
Platelets: 294 K/uL (ref 150–400)
RBC: 3.3 MIL/uL — ABNORMAL LOW (ref 3.87–5.11)
RDW: 17.3 % — ABNORMAL HIGH (ref 11.5–15.5)
WBC: 15.3 K/uL — ABNORMAL HIGH (ref 4.0–10.5)
nRBC: 0 % (ref 0.0–0.2)

## 2024-08-03 LAB — BASIC METABOLIC PANEL WITH GFR
Anion gap: 6 (ref 5–15)
BUN: 11 mg/dL (ref 8–23)
CO2: 25 mmol/L (ref 22–32)
Calcium: 8.6 mg/dL — ABNORMAL LOW (ref 8.9–10.3)
Chloride: 105 mmol/L (ref 98–111)
Creatinine, Ser: 0.91 mg/dL (ref 0.44–1.00)
GFR, Estimated: >60 mL/min (ref >60)
Glucose, Bld: 169 mg/dL — ABNORMAL HIGH (ref 70–99)
Potassium: 3.8 mmol/L (ref 3.5–5.1)
Sodium: 136 mmol/L (ref 135–145)

## 2024-08-03 LAB — GLUCOSE, CAPILLARY
Glucose-Capillary: 177 mg/dL — ABNORMAL HIGH (ref 70–99)
Glucose-Capillary: 227 mg/dL — ABNORMAL HIGH (ref 70–99)
Glucose-Capillary: 234 mg/dL — ABNORMAL HIGH (ref 70–99)
Glucose-Capillary: 239 mg/dL — ABNORMAL HIGH (ref 70–99)

## 2024-08-03 MED ORDER — POTASSIUM CHLORIDE CRYS ER 20 MEQ PO TBCR
40.0000 meq | EXTENDED_RELEASE_TABLET | Freq: Once | ORAL | Status: AC
  Administered 2024-08-03: 40 meq via ORAL
  Filled 2024-08-03: qty 2

## 2024-08-03 MED ORDER — METOPROLOL TARTRATE 50 MG PO TABS: 50.0000 mg | ORAL_TABLET | Freq: Four times a day (QID) | ORAL | Status: DC

## 2024-08-03 MED ORDER — AZITHROMYCIN 250 MG PO TABS
500.0000 mg | ORAL_TABLET | Freq: Every day | ORAL | Status: DC
  Administered 2024-08-03: 500 mg via ORAL
  Filled 2024-08-03: qty 2

## 2024-08-03 MED ORDER — METOPROLOL TARTRATE 50 MG PO TABS
50.0000 mg | ORAL_TABLET | Freq: Four times a day (QID) | ORAL | Status: DC
Start: 2024-08-03 — End: 2024-08-03

## 2024-08-03 MED ORDER — METOPROLOL TARTRATE 50 MG PO TABS
50.0000 mg | ORAL_TABLET | Freq: Four times a day (QID) | ORAL | Status: DC
  Administered 2024-08-03 – 2024-08-04 (×4): 50 mg via ORAL
  Filled 2024-08-03 (×4): qty 1

## 2024-08-03 NOTE — Plan of Care (Signed)
   Problem: Education: Goal: Knowledge of General Education information will improve Description Including pain rating scale, medication(s)/side effects and non-pharmacologic comfort measures Outcome: Progressing

## 2024-08-03 NOTE — Progress Notes (Signed)
 PHARMACY CONSULT NOTE  Pharmacy Consult for Electrolyte Monitoring and Replacement   Recent Labs: Potassium (mmol/L)  Date Value  08/03/2024 3.8   Magnesium (mg/dL)  Date Value  89/88/7974 1.9   Calcium  (mg/dL)  Date Value  89/87/7974 8.6 (L)   Albumin (g/dL)  Date Value  89/90/7974 3.6   Phosphorus (mg/dL)  Date Value  89/88/7974 2.6   Sodium (mmol/L)  Date Value  08/03/2024 136   Assessment: 78 yo F with chief complaints generalized weakness, N&V, heart palpitations presenting with sepsis secondary to pneumonia. PMH includes PAF, colon cancer, HTN, PAD, T2DM, TIA. Patient's renal function looks to be at baseline.  Goal of Therapy:  Electrolytes WNL  Plan:  --K 3.8, Kcl 40 mEq PO x 1 --Will sign off on consult given overall stability  Marolyn KATHEE Mare 08/03/24

## 2024-08-03 NOTE — Evaluation (Signed)
 Physical Therapy Evaluation Patient Details Name: Dana Mcclure MRN: [id number] DOB: 29-Dec-1945 Today's Date: 08/03/2024  History of Present Illness  Dana Mcclure is a 78 y.o. female with medical history significant for PAD s/p stents in BLE, HFpEF, HTN, with most recent admission in January 2025 with pneumonia during which she was briefly in A-fib, currently on Eliquis, being admitted with sepsis secondary to multifocal pneumonia.  Clinical Impression  Patient seen for initial PT evaluation due to decline in functional status. Patient is A&O x 4. Baseline mobility reported as independent, currently requiring minA with RW in room for 25 feet. Gait assessed with RW, limited by elevated HR and fatigue at rest. Vitals unstable to HR fluctuating 115 to 160 bpm and SpO? 80 - 95% on 4 L/min. Nurse informed of pt. Performance. Pt lives alone with 5 STE hand rails on both side with help from daughter intermittently. Pt likely d/c to living with daughter in which she will have help more readily. Clinical impression: patient presents with moderate mobility limitations secondary to long term medical complications. Recommend skilled PT to address safety, mobility, and discharge planning.          If plan is discharge home, recommend the following: A lot of help with walking and/or transfers;A lot of help with bathing/dressing/bathroom   Can travel by private vehicle   Yes    Equipment Recommendations Rolling walker (2 wheels);BSC/3in1  Recommendations for Other Services       Functional Status Assessment Patient has had a recent decline in their functional status and demonstrates the ability to make significant improvements in function in a reasonable and predictable amount of time.     Precautions / Restrictions Precautions Precautions: Fall Precaution/Restrictions Comments: monitor O2 and HR Restrictions Weight Bearing Restrictions Per Provider Order: No      Mobility  Bed  Mobility                    Transfers Overall transfer level: Needs assistance Equipment used: 1 person hand held assist Transfers: Sit to/from Stand Sit to Stand: Min assist           General transfer comment: vc for hand placement    Ambulation/Gait Ambulation/Gait assistance: Min assist Gait Distance (Feet): 25 Feet Assistive device: Standard walker Gait Pattern/deviations: Step-through pattern, Decreased stride length, Trunk flexed, Narrow base of support       General Gait Details: reduced to room walking due to elevated HR at baseline; increased RR after gait <37min. to recover; vc for breathing technique; post education provided  Stairs            Wheelchair Mobility     Tilt Bed    Modified Rankin (Stroke Patients Only)       Balance Overall balance assessment: Needs assistance Sitting-balance support: Bilateral upper extremity supported Sitting balance-Leahy Scale: Good     Standing balance support: Bilateral upper extremity supported Standing balance-Leahy Scale: Fair                               Pertinent Vitals/Pain Pain Assessment Pain Assessment: No/denies pain    Home Living Family/patient expects to be discharged to:: Private residence Living Arrangements: Spouse/significant other Available Help at Discharge: Family (can live alone with occassioal check ins from daugther or she can live with daugther to will provided PRN care when available) Type of Home: Mobile home (she lives in mobile home but her  daugther lives in house which she is likely to d/c to) Home Access: Stairs to enter Entrance Stairs-Rails: Right;Left;Can reach both (her dauthers house has 0 STE) Secretary/administrator of Steps: 5   Home Layout: One level (her daugther house is multi-level but she is able to live on the first floor) Home Equipment: None      Prior Function Prior Level of Function : Independent/Modified Independent              Mobility Comments: independent for all mobility at baseline       Extremity/Trunk Assessment        Lower Extremity Assessment Lower Extremity Assessment: Generalized weakness    Cervical / Trunk Assessment Cervical / Trunk Assessment: Normal  Communication   Communication Communication: No apparent difficulties Factors Affecting Communication: Hearing impaired    Cognition Arousal: Alert Behavior During Therapy: WFL for tasks assessed/performed   PT - Cognitive impairments: No apparent impairments                         Following commands: Intact       Cueing Cueing Techniques: Verbal cues, Tactile cues     General Comments      Exercises Other Exercises Other Exercises: pt educated on breathing techniques, likely need for SNF placement, importance of UE/LE movement/exercise   Assessment/Plan    PT Assessment Patient needs continued PT services  PT Problem List Decreased activity tolerance;Decreased balance;Cardiopulmonary status limiting activity       PT Treatment Interventions DME instruction;Gait training;Functional mobility training;Therapeutic activities;Balance training;Neuromuscular re-education;Patient/family education    PT Goals (Current goals can be found in the Care Plan section)  Acute Rehab PT Goals Patient Stated Goal: pt wants to get stronger PT Goal Formulation: With patient Time For Goal Achievement: 08/17/24 Potential to Achieve Goals: Fair    Frequency Min 3X/week     Co-evaluation               AM-PAC PT 6 Clicks Mobility  Outcome Measure Help needed turning from your back to your side while in a flat bed without using bedrails?: None Help needed moving from lying on your back to sitting on the side of a flat bed without using bedrails?: A Little Help needed moving to and from a bed to a chair (including a wheelchair)?: A Little Help needed standing up from a chair using your arms (e.g., wheelchair or  bedside chair)?: A Little Help needed to walk in hospital room?: A Lot Help needed climbing 3-5 steps with a railing? : Total 6 Click Score: 16    End of Session Equipment Utilized During Treatment: Gait belt Activity Tolerance: Patient limited by fatigue Patient left: in chair;with chair alarm set Nurse Communication: Mobility status PT Visit Diagnosis: Unsteadiness on feet (R26.81);Difficulty in walking, not elsewhere classified (R26.2)    Time: 0938-1000 PT Time Calculation (min) (ACUTE ONLY): 22 min   Charges:   PT Evaluation $PT Eval Low Complexity: 1 Low   PT General Charges $$ ACUTE PT VISIT: 1 Visit         Sherlean Lesches DPT, PT    Dana Mcclure 08/03/2024, 11:40 AM

## 2024-08-03 NOTE — Progress Notes (Addendum)
 PROGRESS NOTE    Dana Mcclure  FMW:[id number] DOB: 1945-12-25 DOA: 07/31/2024 PCP: [facility]     Brief Narrative:   Dana Mcclure is a 78 y.o. female with medical history significant for PAD s/p stents in BLE, HFpEF, HTN, with most recent admission in January 2025 with pneumonia during which she was briefly in A-fib, currently on Eliquis, being admitted with sepsis secondary to multifocal pneumonia.  Patient had a recent bowel prep for capsule endoscopy on 10/6 and was doing well until  last night when she developed shortness of breath, heart palpitations and weakness.  She denied cough, fever or chills or chest pain.  With EMS O2 sat was 88% on room air, arriving on 2 L. In the ED O2 sat 88% on room air requiring up to 4 L to maintain sats in the low to mid 90s Labs notable for WBC 22,000 with lactic acid 2.7 VBG with venous pH 7.44 and pCO2 39, normal bicarb Respiratory viral panel negative Troponin 44 and BNP 243 Hemoglobin 11 BMP notable for glucose 221 with minor electrolyte abnormalities of sodium 134 potassium 3.2.  Creatinine 1.3 which is above baseline EKG showing sinus at 94 with nonspecific ST-T wave changes Chest x-ray showing patchy infiltrates possibly edema or pneumonia CT chest abdomen and pelvis without contrast showing chronic findings Patient was treated with an LR bolus started on Rocephin and azithromycin   Assessment & Plan:   Principal Problem:   Sepsis due to pneumonia [facility]) Active Problems:   Acute respiratory failure with hypoxia (HCC)   Elevated troponin   Chronic heart failure with preserved ejection fraction (HFpEF) (HCC)   Hyperglycemia   History of colon cancer   Hypertension, essential   Tobacco abuse   PAD with history of vascular stents (peripheral artery disease)   Paroxysmal atrial fibrillation (HCC)   IDA (iron deficiency anemia)   Electrolyte abnormality   Type 2 diabetes mellitus without complication, without long-term  current use of insulin (HCC)   CKD stage 3b, GFR 30-44 ml/min (HCC)  # Bacteremia Anaerobic in both initial sets growing klebsiella aerogenes. Source unclear, urine vs gi tract most likely though urine growing enterococcus. Reviewed case w/ dr. Luiz of ID on 10/11. Possible translocation during recent bowel prep? - continue cefepime - follow urine culture and blood cultures  # CAP? Confusion and cough and hypoxia and leukocytosis, CT though more suggestive of chronic changes and has bacteremia as above so this may be a red herring. Respiratory panels negative. Not producing sputum - continue cefepime as above, with azithromycin  # Acute hypoxic respiratory failure To upper 80s on arrival, today low 90s on room air - monitor - continue Pace O2 - start IS  # Sepsis By leukocytosis, tachycardia. Likely 2/2 cap as above. Sepsis resolved  # Metabolic encephalopathy Likely 2/2 above. Resolved - treat underlying issues  # Pyuria Asymptomatic. Non-obstructing left sided stones - follow culture - continue cefepime as above  # Diarrhea Per report.  Did have colon prep several days ago so that also could be etiology. C diff and gi pathogen panel negative. Patient says improving.  # Left distal ureteral stones On CT, not obstructing, patient denies pain - outpt urology f/u for now though may need repeat abdominal imaging if symptoms develop or if persistent fevers, etc  # Paroxysmal a fib Rvr persists - home apixaban - increase metop to 50 bid  # HTN Bp mildly elevated - cont home metop - hold home  losart, spiro  # HFpef Appears euvolemic - gentle fluids  - holding home spiro - cont home metop  # Obesity Noted  # CKD 3b Kidney function at baseline  # T2DM Euglycemic - SSI  # Debility - PT eval  DVT prophylaxis: apixaban Code Status: full Family Communication: daughter telephonically 10/12  Level of care: Progressive Status is: inpt    Consultants:   none  Procedures: none  Antimicrobials:  See above    Subjective: Feeling ok, no cough, no pain  Objective: Vitals:   08/03/24 0751 08/03/24 0840 08/03/24 0841 08/03/24 0859  BP:   125/76   Pulse:   70   Resp:   (!) 22   Temp: 97.7 F (36.5 C)     TempSrc:      SpO2:  (!) 88%  93%  Weight:      Height:        Intake/Output Summary (Last 24 hours) at 08/03/2024 1053 Last data filed at 08/03/2024 0834 Gross per 24 hour  Intake 511.9 ml  Output 150 ml  Net 361.9 ml   Filed Weights   08/02/24 1935  Weight: 93.8 kg    Examination:  General exam: Appears calm and comfortable Respiratory system: rales at bases, otherwise clear Cardiovascular system: S1 & S2 heard, RRR.   Gastrointestinal system: Abdomen is nondistended, soft and nontender.   Central nervous system: moving all 4, alert and oriented Extremities: Symmetric 5 x 5 power. Skin: No rashes, lesions or ulcers Psychiatry: calm    Data Reviewed: I have personally reviewed following labs and imaging studies  CBC: Recent Labs  Lab 07/31/24 1912 08/01/24 0531 08/02/24 0347 08/03/24 0326  WBC 22.6* 20.9* 17.6* 15.3*  HGB 11.4* 10.3* 9.5* 9.3*  HCT 34.4* 31.3* 30.1* 28.7*  MCV 86.4 86.7 87.8 87.0  PLT 401* 357 306 294   Basic Metabolic Panel: Recent Labs  Lab 07/31/24 1912 08/01/24 0531 08/02/24 0347 08/03/24 0326  NA 134* 137 136 136  K 3.2* 3.7 3.6 3.8  CL 96* 100 101 105  CO2 24 26 23 25   GLUCOSE 221* 181* 199* 169*  BUN 16 14 13 11   CREATININE 1.30* 1.28* 1.04* 0.91  CALCIUM  9.1 8.9 8.8* 8.6*  MG  --   --  1.9  --   PHOS  --   --  2.6  --    GFR: Estimated Creatinine Clearance: 56.5 mL/min (by C-G formula based on SCr of 0.91 mg/dL). Liver Function Tests: Recent Labs  Lab 07/31/24 1937  AST 24  ALT 13  ALKPHOS 40  BILITOT 0.6  PROT 7.3  ALBUMIN 3.6   No results for input(s): LIPASE, AMYLASE in the last 168 hours. No results for input(s): AMMONIA in the last 168  hours. Coagulation Profile: Recent Labs  Lab 07/31/24 1937  INR 1.3*   Cardiac Enzymes: No results for input(s): CKTOTAL, CKMB, CKMBINDEX, TROPONINI in the last 168 hours. BNP (last 3 results) No results for input(s): PROBNP in the last 8760 hours. HbA1C: Recent Labs    08/01/24 0531  HGBA1C 7.4*   CBG: Recent Labs  Lab 08/02/24 0828 08/02/24 1223 08/02/24 1601 08/02/24 1958 08/03/24 0748  GLUCAP 171* 220* 185* 293* 177*   Lipid Profile: No results for input(s): CHOL, HDL, LDLCALC, TRIG, CHOLHDL, LDLDIRECT in the last 72 hours. Thyroid Function Tests: No results for input(s): TSH, T4TOTAL, FREET4, T3FREE, THYROIDAB in the last 72 hours. Anemia Panel: No results for input(s): VITAMINB12, FOLATE, FERRITIN, TIBC, IRON, RETICCTPCT in  the last 72 hours. Urine analysis:    Component Value Date/Time   COLORURINE YELLOW (A) 07/31/2024 2138   APPEARANCEUR HAZY (A) 07/31/2024 2138   LABSPEC 1.013 07/31/2024 2138   PHURINE 5.0 07/31/2024 2138   GLUCOSEU NEGATIVE 07/31/2024 2138   HGBUR SMALL (A) 07/31/2024 2138   BILIRUBINUR NEGATIVE 07/31/2024 2138   KETONESUR NEGATIVE 07/31/2024 2138   PROTEINUR 30 (A) 07/31/2024 2138   NITRITE POSITIVE (A) 07/31/2024 2138   LEUKOCYTESUR NEGATIVE 07/31/2024 2138   Sepsis Labs: @LABRCNTIP (procalcitonin:4,lacticidven:4)  ) Recent Results (from the past 240 hours)  Resp panel by RT-PCR (RSV, Flu A&B, Covid) Anterior Nasal Swab     Status: None   Collection Time: 07/31/24  7:11 PM   Specimen: Anterior Nasal Swab  Result Value Ref Range Status   SARS Coronavirus 2 by RT PCR NEGATIVE NEGATIVE Final    Comment: (NOTE) SARS-CoV-2 target nucleic acids are NOT DETECTED.  The SARS-CoV-2 RNA is generally detectable in upper respiratory specimens during the acute phase of infection. The lowest concentration of SARS-CoV-2 viral copies this assay can detect is 138 copies/mL. A negative result does  not preclude SARS-Cov-2 infection and should not be used as the sole basis for treatment or other patient management decisions. A negative result may occur with  improper specimen collection/handling, submission of specimen other than nasopharyngeal swab, presence of viral mutation(s) within the areas targeted by this assay, and inadequate number of viral copies(<138 copies/mL). A negative result must be combined with clinical observations, patient history, and epidemiological information. The expected result is Negative.  Fact Sheet for Patients:  BloggerCourse.com  Fact Sheet for Healthcare Providers:  SeriousBroker.it  This test is no t yet approved or cleared by the [place]  FDA and  has been authorized for detection and/or diagnosis of SARS-CoV-2 by FDA under an Emergency Use Authorization (EUA). This EUA will remain  in effect (meaning this test can be used) for the duration of the COVID-19 declaration under Section 564(b)(1) of the Act, 21 U.S.C.section 360bbb-3(b)(1), unless the authorization is terminated  or revoked sooner.       Influenza A by PCR NEGATIVE NEGATIVE Final   Influenza B by PCR NEGATIVE NEGATIVE Final    Comment: (NOTE) The Xpert Xpress SARS-CoV-2/FLU/RSV plus assay is intended as an aid in the diagnosis of influenza from Nasopharyngeal swab specimens and should not be used as a sole basis for treatment. Nasal washings and aspirates are unacceptable for Xpert Xpress SARS-CoV-2/FLU/RSV testing.  Fact Sheet for Patients: BloggerCourse.com  Fact Sheet for Healthcare Providers: SeriousBroker.it  This test is not yet approved or cleared by the [place]  FDA and has been authorized for detection and/or diagnosis of SARS-CoV-2 by FDA under an Emergency Use Authorization (EUA). This EUA will remain in effect (meaning this test can be used) for the  duration of the COVID-19 declaration under Section 564(b)(1) of the Act, 21 U.S.C. section 360bbb-3(b)(1), unless the authorization is terminated or revoked.     Resp Syncytial Virus by PCR NEGATIVE NEGATIVE Final    Comment: (NOTE) Fact Sheet for Patients: BloggerCourse.com  Fact Sheet for Healthcare Providers: SeriousBroker.it  This test is not yet approved or cleared by the [place]  FDA and has been authorized for detection and/or diagnosis of SARS-CoV-2 by FDA under an Emergency Use Authorization (EUA). This EUA will remain in effect (meaning this test can be used) for the duration of the COVID-19 declaration under Section 564(b)(1) of the Act, 21 U.S.C. section 360bbb-3(b)(1), unless the authorization is  terminated or revoked.  Performed at [facility], [address], [place], [place] [address]   Blood Culture (routine x 2)     Status: Abnormal (Preliminary result)   Collection Time: 07/31/24  7:37 PM   Specimen: BLOOD  Result Value Ref Range Status   Specimen Description   Final    BLOOD RIGHT ANTECUBITAL Performed at [facility], [address], [place], [place] [address]    Special Requests   Final    Blood Culture results may not be optimal due to an inadequate volume of blood received in culture bottles BOTTLES DRAWN AEROBIC AND ANAEROBIC Performed at [facility], [address] [place], [place] [address]    Culture  Setup Time   Final    GRAM NEGATIVE RODS IN BOTH AEROBIC AND ANAEROBIC BOTTLES CRITICAL VALUE NOTED.  VALUE IS CONSISTENT WITH PREVIOUSLY REPORTED AND CALLED VALUE. GRAM STAIN REVIEWED-AGREE WITH RESULT DRT Performed at [facility], [address] [place], [place] [address]    Culture (A)  Final    KLEBSIELLA AEROGENES CULTURE REINCUBATED FOR BETTER GROWTH Performed at [facility], [address], [place], [place] [address]    Report  Status PENDING  Incomplete  Blood Culture (routine x 2)     Status: Abnormal (Preliminary result)   Collection Time: 07/31/24  8:19 PM   Specimen: BLOOD LEFT HAND  Result Value Ref Range Status   Specimen Description   Final    BLOOD LEFT HAND Performed at [facility], [address], [place], [place] [address]    Special Requests   Final    Blood Culture results may not be optimal due to an inadequate volume of blood received in culture bottles BOTTLES DRAWN AEROBIC AND ANAEROBIC Performed at [facility], [address] [place], [place] [address]    Culture  Setup Time   Final    GRAM NEGATIVE RODS IN BOTH AEROBIC AND ANAEROBIC BOTTLES CRITICAL RESULT CALLED TO, READ BACK BY AND VERIFIED WITH: RODNEY GRUBB, PHARMD AT 1153 ON 08/01/24 BY GM GRAM STAIN REVIEWED-AGREE WITH RESULT DRT CRITICAL VALUE NOTED.  VALUE IS CONSISTENT WITH PREVIOUSLY REPORTED AND CALLED VALUE. Performed at [facility], [address] [place], [place] [address]    Culture (A)  Final    KLEBSIELLA AEROGENES SUSCEPTIBILITIES TO FOLLOW Performed at [facility], [address] [place], [place] [address]    Report Status PENDING  Incomplete  Blood Culture ID Panel (Reflexed)     Status: Abnormal   Collection Time: 07/31/24  8:19 PM  Result Value Ref Range Status   Enterococcus faecalis NOT DETECTED NOT DETECTED Final   Enterococcus Faecium NOT DETECTED NOT DETECTED Final   Listeria monocytogenes NOT DETECTED NOT DETECTED Final   Staphylococcus species NOT DETECTED NOT DETECTED Final   Staphylococcus aureus (BCID) NOT DETECTED NOT DETECTED Final   Staphylococcus epidermidis NOT DETECTED NOT DETECTED Final   Staphylococcus lugdunensis NOT DETECTED NOT DETECTED Final   Streptococcus species NOT DETECTED NOT DETECTED Final   Streptococcus agalactiae NOT DETECTED NOT DETECTED Final   Streptococcus pneumoniae NOT DETECTED NOT DETECTED Final   Streptococcus pyogenes NOT DETECTED  NOT DETECTED Final   A.calcoaceticus-baumannii NOT DETECTED NOT DETECTED Final   Bacteroides fragilis NOT DETECTED NOT DETECTED Final   Enterobacterales DETECTED (A) NOT DETECTED Final    Comment: Enterobacterales represent a large order of gram negative bacteria, not a single organism. CRITICAL RESULT CALLED TO, READ  BACK BY AND VERIFIED WITH: RODNEY GRUBB, PHARMD AT 1153 ON 08/01/24 BY GM    Enterobacter cloacae complex NOT DETECTED NOT DETECTED Final   Escherichia coli NOT DETECTED NOT DETECTED Final   Klebsiella aerogenes DETECTED (A) NOT DETECTED Final    Comment: CRITICAL RESULT CALLED TO, READ BACK BY AND VERIFIED WITH: RODNEY GRUBB, PHARMD AT 1153 ON 08/01/24 BY GM    Klebsiella oxytoca NOT DETECTED NOT DETECTED Final   Klebsiella pneumoniae NOT DETECTED NOT DETECTED Final   Proteus species NOT DETECTED NOT DETECTED Final   Salmonella species NOT DETECTED NOT DETECTED Final   Serratia marcescens NOT DETECTED NOT DETECTED Final   Haemophilus influenzae NOT DETECTED NOT DETECTED Final   Neisseria meningitidis NOT DETECTED NOT DETECTED Final   Pseudomonas aeruginosa NOT DETECTED NOT DETECTED Final   Stenotrophomonas maltophilia NOT DETECTED NOT DETECTED Final   Candida albicans NOT DETECTED NOT DETECTED Final   Candida auris NOT DETECTED NOT DETECTED Final   Candida glabrata NOT DETECTED NOT DETECTED Final   Candida krusei NOT DETECTED NOT DETECTED Final   Candida parapsilosis NOT DETECTED NOT DETECTED Final   Candida tropicalis NOT DETECTED NOT DETECTED Final   Cryptococcus neoformans/gattii NOT DETECTED NOT DETECTED Final   CTX-M ESBL NOT DETECTED NOT DETECTED Final   Carbapenem resistance IMP NOT DETECTED NOT DETECTED Final   Carbapenem resistance KPC NOT DETECTED NOT DETECTED Final   Carbapenem resistance NDM NOT DETECTED NOT DETECTED Final   Carbapenem resist OXA 48 LIKE NOT DETECTED NOT DETECTED Final   Carbapenem resistance VIM NOT DETECTED NOT DETECTED Final     Comment: Performed at [facility], [address], [place], [place] [address]  Urine Culture     Status: Abnormal (Preliminary result)   Collection Time: 07/31/24  9:38 PM   Specimen: Urine, Random  Result Value Ref Range Status   Specimen Description   Final    URINE, RANDOM Performed at [facility], [address] [place], [place] [address]    Special Requests   Final    NONE Reflexed from [id number] Performed at [facility], [address], [place], [place] [address]    Culture (A)  Final    30,000 COLONIES/mL ENTEROCOCCUS FAECALIS SUSCEPTIBILITIES TO FOLLOW Performed at [facility], [address], [place], [place] [address]    Report Status PENDING  Incomplete  Respiratory (~20 pathogens) panel by PCR     Status: None   Collection Time: 08/01/24 10:14 AM   Specimen: Nasopharyngeal Swab; Respiratory  Result Value Ref Range Status   Adenovirus NOT DETECTED NOT DETECTED Final   Coronavirus 229E NOT DETECTED NOT DETECTED Final    Comment: (NOTE) The Coronavirus on the Respiratory Panel, DOES NOT test for the novel  Coronavirus (2019 nCoV)    Coronavirus HKU1 NOT DETECTED NOT DETECTED Final   Coronavirus NL63 NOT DETECTED NOT DETECTED Final   Coronavirus OC43 NOT DETECTED NOT DETECTED Final   Metapneumovirus NOT DETECTED NOT DETECTED Final   Rhinovirus / Enterovirus NOT DETECTED NOT DETECTED Final   Influenza A NOT DETECTED NOT DETECTED Final   Influenza B NOT DETECTED NOT DETECTED Final   Parainfluenza Virus 1 NOT DETECTED NOT DETECTED Final   Parainfluenza Virus 2 NOT DETECTED NOT DETECTED Final   Parainfluenza Virus 3 NOT DETECTED NOT DETECTED Final   Parainfluenza Virus 4 NOT DETECTED NOT DETECTED Final   Respiratory Syncytial Virus NOT DETECTED NOT DETECTED Final   Bordetella pertussis NOT DETECTED NOT DETECTED  Final   Bordetella Parapertussis NOT DETECTED NOT DETECTED Final   Chlamydophila pneumoniae NOT DETECTED NOT DETECTED Final    Mycoplasma pneumoniae NOT DETECTED NOT DETECTED Final    Comment: Performed at [facility], [address] [place], [place] [address]  C Difficile Quick Screen w PCR reflex     Status: None   Collection Time: 08/01/24  5:00 PM   Specimen: STOOL  Result Value Ref Range Status   C Diff antigen NEGATIVE NEGATIVE Final   C Diff toxin NEGATIVE NEGATIVE Final   C Diff interpretation No C. difficile detected.  Final    Comment: Performed at [facility], [address] [place], [place] [address]  Gastrointestinal Panel by PCR , Stool     Status: None   Collection Time: 08/01/24  5:00 PM   Specimen: Stool  Result Value Ref Range Status   Campylobacter species NOT DETECTED NOT DETECTED Final   Plesimonas shigelloides NOT DETECTED NOT DETECTED Final   Salmonella species NOT DETECTED NOT DETECTED Final   Yersinia enterocolitica NOT DETECTED NOT DETECTED Final   Vibrio species NOT DETECTED NOT DETECTED Final   Vibrio cholerae NOT DETECTED NOT DETECTED Final   Enteroaggregative E coli (EAEC) NOT DETECTED NOT DETECTED Final   Enteropathogenic E coli (EPEC) NOT DETECTED NOT DETECTED Final   Enterotoxigenic E coli (ETEC) NOT DETECTED NOT DETECTED Final   Shiga like toxin producing E coli (STEC) NOT DETECTED NOT DETECTED Final   Shigella/Enteroinvasive E coli (EIEC) NOT DETECTED NOT DETECTED Final   Cryptosporidium NOT DETECTED NOT DETECTED Final   Cyclospora cayetanensis NOT DETECTED NOT DETECTED Final   Entamoeba histolytica NOT DETECTED NOT DETECTED Final   Giardia lamblia NOT DETECTED NOT DETECTED Final   Adenovirus F40/41 NOT DETECTED NOT DETECTED Final   Astrovirus NOT DETECTED NOT DETECTED Final   Norovirus GI/GII NOT DETECTED NOT DETECTED Final   Rotavirus A NOT DETECTED NOT DETECTED Final   Sapovirus (I, II, IV, and V) NOT DETECTED NOT DETECTED Final    Comment: Performed at [facility], [address] [place], [place] [address]  Culture, blood (Routine X  2) w Reflex to ID Panel     Status: None (Preliminary result)   Collection Time: 08/02/24 12:10 PM   Specimen: BLOOD  Result Value Ref Range Status   Specimen Description BLOOD BLOOD RIGHT HAND  Final   Special Requests   Final    BOTTLES DRAWN AEROBIC AND ANAEROBIC Blood Culture results may not be optimal due to an inadequate volume of blood received in culture bottles   Culture   Final    NO GROWTH < 24 HOURS Performed at [facility], [address] [place], [place] [address]    Report Status PENDING  Incomplete  Culture, blood (Routine X 2) w Reflex to ID Panel     Status: None (Preliminary result)   Collection Time: 08/02/24  8:32 PM   Specimen: BLOOD  Result Value Ref Range Status   Specimen Description BLOOD BLOOD RIGHT HAND  Final   Special Requests   Final    BOTTLES DRAWN AEROBIC ONLY Blood Culture results may not be optimal due to an inadequate volume of blood received in culture bottles   Culture   Final    NO GROWTH < 12 HOURS Performed at [facility], [address], [place], [place] [address]    Report Status PENDING  Incomplete         Radiology Studies:  DG Chest 1 View Result Date: 08/02/2024 CLINICAL DATA:  Difficulty breathing EXAM: PORTABLE CHEST 1 VIEW COMPARISON:  07/31/2024 FINDINGS: Cardiac shadows within normal limits. Aortic calcifications are seen. Patchy bibasilar airspace opacities are noted left greater than right consistent with early infiltrate. No sizable effusion is seen. IMPRESSION: Patchy bibasilar infiltrates. Electronically Signed   By: Oneil Devonshire M.D.   On: 08/02/2024 02:46        Scheduled Meds:  apixaban  5 mg Oral BID   azithromycin  500 mg Oral QHS   guaiFENesin  600 mg Oral BID   Influenza vac split trivalent PF  0.5 mL Intramuscular Tomorrow-1000   insulin aspart  0-15 Units Subcutaneous TID WC   insulin aspart  0-5 Units Subcutaneous QHS   metoprolol tartrate  50 mg Oral BID   potassium chloride  40  mEq Oral Once   Continuous Infusions:  ceFEPime (MAXIPIME) IV Stopped (08/02/24 2337)     LOS: 2 days     Devaughn KATHEE Ban, MD [facility]   If 7PM-7AM, please contact night-coverage [email or web address] Password [email or web address] 08/03/2024, 10:53 AM

## 2024-08-04 ENCOUNTER — Other Ambulatory Visit: Payer: Self-pay

## 2024-08-04 DIAGNOSIS — A419 Sepsis, unspecified organism: Secondary | ICD-10-CM | POA: Diagnosis not present

## 2024-08-04 DIAGNOSIS — J189 Pneumonia, unspecified organism: Secondary | ICD-10-CM | POA: Diagnosis not present

## 2024-08-04 LAB — CBC
HCT: 28.7 % — ABNORMAL LOW (ref 36.0–46.0)
Hemoglobin: 9.2 g/dL — ABNORMAL LOW (ref 12.0–15.0)
MCH: 27.7 pg (ref 26.0–34.0)
MCHC: 32.1 g/dL (ref 30.0–36.0)
MCV: 86.4 fL (ref 80.0–100.0)
Platelets: 364 K/uL (ref 150–400)
RBC: 3.32 MIL/uL — ABNORMAL LOW (ref 3.87–5.11)
RDW: 17.5 % — ABNORMAL HIGH (ref 11.5–15.5)
WBC: 14.1 K/uL — ABNORMAL HIGH (ref 4.0–10.5)
nRBC: 0 % (ref 0.0–0.2)

## 2024-08-04 LAB — URINE CULTURE: Culture: 30000 — AB

## 2024-08-04 LAB — CULTURE, BLOOD (ROUTINE X 2)

## 2024-08-04 LAB — BASIC METABOLIC PANEL WITH GFR
Anion gap: 7 (ref 5–15)
BUN: 16 mg/dL (ref 8–23)
CO2: 22 mmol/L (ref 22–32)
Calcium: 8.7 mg/dL — ABNORMAL LOW (ref 8.9–10.3)
Chloride: 107 mmol/L (ref 98–111)
Creatinine, Ser: 0.83 mg/dL (ref 0.44–1.00)
GFR, Estimated: >60 mL/min (ref >60)
Glucose, Bld: 174 mg/dL — ABNORMAL HIGH (ref 70–99)
Potassium: 4 mmol/L (ref 3.5–5.1)
Sodium: 136 mmol/L (ref 135–145)

## 2024-08-04 LAB — GLUCOSE, CAPILLARY: Glucose-Capillary: 182 mg/dL — ABNORMAL HIGH (ref 70–99)

## 2024-08-04 MED ORDER — APIXABAN 2.5 MG PO TABS: 5.0000 mg | ORAL_TABLET | Freq: Two times a day (BID) | ORAL | Status: AC

## 2024-08-04 MED ORDER — LEVOFLOXACIN 750 MG PO TABS
750.0000 mg | ORAL_TABLET | Freq: Every day | ORAL | 0 refills | Status: AC
  Filled 2024-08-04: qty 3, 3d supply, fill #0

## 2024-08-04 NOTE — TOC Transition Note (Signed)
 Transition of Care [facility]) - Discharge Note   Patient Details  Name: Dana Mcclure MRN: [id number] Date of Birth: 1946/05/18  [facility]) CM/SW Contact:  Lauraine JAYSON Carpen, LCSW Phone Number: 08/04/2024, 1:32 PM   Clinical Narrative: Patient has orders to discharge home today. Patient and son have decided not to pursue home health. CSW encouraged them to reach out to her PCP if they change their mind after discharge. She is agreeable to RW and 3-in-1. Ordered through [employer]. No further concerns. CSW signing off.    Final next level of care: Home/Self Care Barriers to Discharge: Barriers Resolved   Patient Goals and CMS Choice            Discharge Placement                  Name of family member notified: Mateya Torti Patient and family notified of of transfer: 08/04/24  Discharge Plan and Services Additional resources added to the After Visit Summary for       Post Acute Care Choice: Home Health          DME Arranged: 3-N-1, Walker rolling DME Agency: [employer] Date DME Agency Contacted: 08/04/24   Representative spoke with at DME Agency: Thomasina            Social Drivers of Health (SDOH) Interventions SDOH Screenings   Food Insecurity: No Food Insecurity (08/03/2024)  Housing: Unknown (08/03/2024)  Transportation Needs: No Transportation Needs (08/03/2024)  Utilities: Low Risk  (06/04/2024)   Received from [facility]  Tobacco Use: Medium Risk (07/31/2024)     Readmission Risk Interventions     No data to display

## 2024-08-04 NOTE — TOC Initial Note (Signed)
 Transition of Care [facility]) - Initial/Assessment Note    Patient Details  Name: Dana Mcclure MRN: [id number] Date of Birth: 12/14/1945  [facility]) CM/SW Contact:    Lauraine JAYSON Carpen, LCSW Phone Number: 08/04/2024, 11:03 AM  Clinical Narrative: CSW met with patient. Son at bedside. CSW introduced role and explained that PT recommendations would be discussed. Patient and son are not interested in SNF placement but are agreeable to home health. Patient will discharge to daughter's home. Son will obtain address for home health referral. Patient confirmed she does not use oxygen at home. She is currently on 1 L. Will follow for this potential discharge need as well. No further concerns. CSW will continue to follow patient for support and facilitate discharge once medically stable.                 Expected Discharge Plan: Home w Home Health Services Barriers to Discharge: Continued Medical Work up   Patient Goals and CMS Choice            Expected Discharge Plan and Services     Post Acute Care Choice: Home Health Living arrangements for the past 2 months: Mobile Home                                      Prior Living Arrangements/Services Living arrangements for the past 2 months: Mobile Home Lives with:: Self Patient language and need for interpreter reviewed:: Yes Do you feel safe going back to the place where you live?: Yes      Need for Family Participation in Patient Care: Yes (Comment) Care giver support system in place?: Yes (comment)   Criminal Activity/Legal Involvement Pertinent to Current Situation/Hospitalization: No - Comment as needed  Activities of Daily Living   ADL Screening (condition at time of admission) Independently performs ADLs?: Yes (appropriate for developmental age) Is the patient deaf or have difficulty hearing?: No Does the patient have difficulty seeing, even when wearing glasses/contacts?: No Does the patient have  difficulty concentrating, remembering, or making decisions?: No  Permission Sought/Granted Permission sought to share information with : Facility Medical sales representative, Family Supports Permission granted to share information with : Yes, Verbal Permission Granted  Share Information with NAME: Norabelle Kondo  Permission granted to share info w AGENCY: Home Health Agencies  Permission granted to share info w Relationship: Son  Permission granted to share info w Contact Information: [phone]  Emotional Assessment Appearance:: Appears stated age Attitude/Demeanor/Rapport: Engaged, Gracious Affect (typically observed): Accepting, Appropriate, Calm, Pleasant Orientation: : Oriented to Place, Oriented to Self, Oriented to  Time, Oriented to Situation Alcohol / Substance Use: Not Applicable Psych Involvement: No (comment)  Admission diagnosis:  Hypoxia [R09.02] Sepsis due to pneumonia (HCC) [J18.9, A41.9] Pneumonia due to infectious organism, unspecified laterality, unspecified part of lung [J18.9] Sepsis, due to unspecified organism, unspecified whether acute organ dysfunction present [facility]) [A41.9] Patient Active Problem List   Diagnosis Date Noted   CKD stage 3b, GFR 30-44 ml/min (HCC) 08/01/2024   Sepsis due to pneumonia (HCC) 07/31/2024   Acute respiratory failure with hypoxia (HCC) 07/31/2024   Chronic heart failure with preserved ejection fraction (HFpEF) (HCC) 07/31/2024   Elevated troponin 07/31/2024   IDA (iron deficiency anemia) 07/31/2024   Electrolyte abnormality 07/31/2024   Hyperglycemia 07/31/2024   Paroxysmal atrial fibrillation (HCC)    H/O right hemicolectomy 06/19/2024   Type 2 diabetes mellitus  without complication, without long-term current use of insulin (HCC) 10/04/2023   Lymphedema 09/03/2023   PAD with history of vascular stents (peripheral artery disease) 09/03/2023   Carotid stenosis 09/27/2022   Rash and nonspecific skin eruption 06/12/2022   Leg pain  05/27/2022   Atherosclerosis of native arteries of extremity with intermittent claudication 05/27/2022   Carotid bruit 05/27/2022   Prediabetes 08/26/2020   Rotator cuff tendinitis, left 04/14/2019   History of transient ischemic attack (TIA) 08/07/2018   Hyperlipidemia 08/07/2018   Hypertension, essential 08/07/2018   Erythrocytosis 09/11/2016   Tobacco abuse 08/08/2016   History of colon cancer 05/20/2015   PCP:  [facility] Primary Pharmacy:   [facility] [phone] [address], [place] - [address] [place] [address] Phone: [phone] Fax: [phone]     Social Drivers of Health (SDOH) Social History: SDOH Screenings   Food Insecurity: No Food Insecurity (08/03/2024)  Housing: Unknown (08/03/2024)  Transportation Needs: No Transportation Needs (08/03/2024)  Utilities: Low Risk  (06/04/2024)   Received from [facility]  Tobacco Use: Medium Risk (07/31/2024)   SDOH Interventions:     Readmission Risk Interventions     No data to display

## 2024-08-04 NOTE — Progress Notes (Signed)
 Mobility Specialist Progress Note:    08/04/24 1208  Mobility  Activity Ambulated with assistance;Pivoted/transferred from bed to chair  Level of Assistance Minimal assist, patient does 75% or more  Assistive Device None (HHA)  Distance Ambulated (ft) 7 ft  Range of Motion/Exercises Active;All extremities  Activity Response Tolerated well  Mobility visit 1 Mobility  Mobility Specialist Start Time (ACUTE ONLY) 1157  Mobility Specialist Stop Time (ACUTE ONLY) 1209  Mobility Specialist Time Calculation (min) (ACUTE ONLY) 12 min   Pt received in bed, son in room. Agreeable to mobility, required MinA to stand and ambulate with 1 person hand-held assist. Tolerated well, SpO2 94-97% on 1L and HR roughly 90 bpm throughout session. Alarm on, belongings in reach. All needs met.  Sherrilee Ditty Mobility Specialist Please contact via Special educational needs teacher or  Rehab office at [phone]

## 2024-08-04 NOTE — Discharge Summary (Signed)
 Dana Mcclure FMW:[id number] DOB: 1946/08/05 DOA: 07/31/2024  PCP: [facility]  Admit date: 07/31/2024 Discharge date: 08/04/2024  Time spent: 35 minutes  Recommendations for Outpatient Follow-up:  Pcp f/u 1 week  Consider urology referral (ureteral stones)    Discharge Diagnoses:  Principal Problem:   Sepsis due to pneumonia [facility]) Active Problems:   Acute respiratory failure with hypoxia (HCC)   Elevated troponin   Chronic heart failure with preserved ejection fraction (HFpEF) (HCC)   Hyperglycemia   History of colon cancer   Hypertension, essential   Tobacco abuse   PAD with history of vascular stents (peripheral artery disease)   Paroxysmal atrial fibrillation (HCC)   IDA (iron deficiency anemia)   Electrolyte abnormality   Type 2 diabetes mellitus without complication, without long-term current use of insulin (HCC)   CKD stage 3b, GFR 30-44 ml/min (HCC)   Discharge Condition: improved  Diet recommendation: heart healthy  Filed Weights   08/02/24 1935  Weight: 93.8 kg    History of present illness:  From admission h and p Dana Mcclure is a 78 y.o. female with medical history significant for PAD s/p stents in BLE, HFpEF, HTN, with most recent admission in January 2025 with pneumonia during which she was briefly in A-fib, currently on Eliquis, being admitted with sepsis secondary to multifocal pneumonia.  Patient had a recent bowel prep for capsule endoscopy on 10/6 and was doing well until  last night when she developed shortness of breath, heart palpitations and weakness.  She denied cough, fever or chills or chest pain.  With EMS O2 sat was 88% on room air, arriving on 2 L. In the ED O2 sat 88% on room air requiring up to 4 L to maintain sats in the low to mid 90s Labs notable for WBC 22,000 with lactic acid 2.7 VBG with venous pH 7.44 and pCO2 39, normal bicarb Respiratory viral panel negative Troponin 44 and BNP 243 Hemoglobin 11 BMP notable for  glucose 221 with minor electrolyte abnormalities of sodium 134 potassium 3.2.  Creatinine 1.3 which is above baseline EKG showing sinus at 94 with nonspecific ST-T wave changes Chest x-ray showing patchy infiltrates possibly edema or pneumonia CT chest abdomen and pelvis without contrast showing chronic findings Patient was treated with an LR bolus started on Rocephin and azithromycin  Hospital Course:   # Bacteremia Anaerobic in both initial sets growing klebsiella aerogenes. Source likely gi tract, recent endoscopy/colonoscopy and bowel prep may have been trigger. Urine growing enterococcus. Reviewed case w/ dr. Luiz of ID on 10/11.   - cefepime transitioned to levofloxacin to complete 7 day course   # CAP? Confusion and cough and hypoxia and leukocytosis, CT though more suggestive of chronic changes and has bacteremia as above so this may be a red herring. Respiratory panels negative. Not producing sputum - treatment as above   # Acute hypoxic respiratory failure To upper 80s on arrival, today weaned to room air. Cap vs atelectasis.    # Sepsis By leukocytosis, tachycardia. Sepsis resolved   # Metabolic encephalopathy 2/2 sepsis. Resolved   # Pyuria Asymptomatic. Non-obstructing left sided stones. Urine culture with less than 100k, no uti.   # Diarrhea Per report.  Did have colon prep several days ago so that also could be etiology. C diff and gi pathogen panel negative. Patient says improving.   # Left distal ureteral stones On CT, not obstructing, patient denies pain  # Paroxysmal a fib Rvr here resolved -  home apixaban at increased dose, advises discuss dosing with prescribing physician - home metop   # HTN Bp mildly elevated - cont home metop - hold home losart, spiro pending pcp f/u   # HFpef Appears euvolemic - gentle fluids  - holding home spiro - cont home metop   # Obesity Noted   # CKD 3b Kidney function at baseline   # T2DM Euglycemic   #  Debility PT advises snf but patient declines that and home health, is open to dme so ordering RW and bedside commode  Procedures: none   Consultations: none  Discharge Exam: Vitals:   08/04/24 1200 08/04/24 1300  BP:    Pulse: 86 98  Resp: (!) 21 17  Temp:    SpO2: 97% 95%    General: NAD Cardiovascular: rales at bases otherwise clear Respiratory: irreg irreg  Discharge Instructions   Discharge Instructions     Diet - low sodium heart healthy   Complete by: As directed    Increase activity slowly   Complete by: As directed       Allergies as of 08/04/2024       Reactions   Empagliflozin Other (See Comments)   YEAST INFECTIONS   Rosuvastatin Nausea And Vomiting   Iodinated Contrast Media Rash        Medication List     PAUSE taking these medications    losartan 25 MG tablet Wait to take this until your doctor or other care provider tells you to start again. Commonly known as: COZAAR Take 1 tablet by mouth daily.   spironolactone 25 MG tablet Wait to take this until your doctor or other care provider tells you to start again. Commonly known as: ALDACTONE Take 25 mg by mouth daily.   torsemide 20 MG tablet Wait to take this until your doctor or other care provider tells you to start again. Commonly known as: DEMADEX Take 20 mg by mouth daily.       STOP taking these medications    aspirin  EC 81 MG tablet   clopidogrel  75 MG tablet Commonly known as: PLAVIX    naproxen sodium 220 MG tablet Commonly known as: ALEVE       TAKE these medications    acetaminophen  325 MG tablet Commonly known as: TYLENOL  Take 650 mg by mouth every 4 (four) hours as needed.   apixaban 2.5 MG Tabs tablet Commonly known as: Eliquis Take 2 tablets (5 mg total) by mouth 2 (two) times daily. What changed: how much to take   ferrous sulfate 324 (65 Fe) MG Tbec Take 324 mg by mouth daily.   levofloxacin 750 MG tablet Commonly known as: Levaquin Take 1  tablet (750 mg total) by mouth at bedtime.   metoprolol succinate 25 MG 24 hr tablet Commonly known as: TOPROL-XL Take 25 mg by mouth daily.               Durable Medical Equipment  (From admission, onward)           Start     Ordered   08/04/24 1329  For home use only DME Walker rolling  Once       Question Answer Comment  Walker: With 5 Inch Wheels   Patient needs a walker to treat with the following condition Bacteremia      08/04/24 1328   08/04/24 1329  For home use only DME Bedside commode  Once       Question:  Patient needs a  bedside commode to treat with the following condition  Answer:  Bacteremia   08/04/24 1328           Allergies  Allergen Reactions   Empagliflozin Other (See Comments)    YEAST INFECTIONS   Rosuvastatin Nausea And Vomiting   Iodinated Contrast Media Rash    Follow-up Information     [facility] Follow up.   Why: 1 week Contact information: [address] [place] [address] [phone]                  The results of significant diagnostics from this hospitalization (including imaging, microbiology, ancillary and laboratory) are listed below for reference.    Significant Diagnostic Studies: DG Chest 1 View Result Date: 08/02/2024 CLINICAL DATA:  Difficulty breathing EXAM: PORTABLE CHEST 1 VIEW COMPARISON:  07/31/2024 FINDINGS: Cardiac shadows within normal limits. Aortic calcifications are seen. Patchy bibasilar airspace opacities are noted left greater than right consistent with early infiltrate. No sizable effusion is seen. IMPRESSION: Patchy bibasilar infiltrates. Electronically Signed   By: Oneil Devonshire M.D.   On: 08/02/2024 02:46   CT HEAD WO CONTRAST ( ) Result Date: 08/01/2024 EXAM: CT HEAD WITHOUT CONTRAST 08/01/2024 10:09:48 AM TECHNIQUE: CT of the head was performed without the administration of intravenous contrast. Automated exposure control, iterative reconstruction, and/or weight based  adjustment of the mA/kV was utilized to reduce the radiation dose to as low as reasonably achievable. COMPARISON: CT head 09/22/2011 CLINICAL HISTORY: Encephalopathy. FINDINGS: BRAIN AND VENTRICLES: There is no evidence of an acute infarct, intracranial hemorrhage, mass, midline shift, hydrocephalus, or extra-axial fluid collection. Mild cerebral atrophy with normal limits for age. The ventricles are normal in size. Periventricular white matter hypodensities are nonspecific but compatible with mild chronic small vessel ischemic disease. Calcified atherosclerosis at the skull base. No suspicious acute vascular hyperdensity. ORBITS: No acute abnormality. SINUSES: Minimal mucosal thickening in the included paranasal sinuses. Trace left mastoid fluid. SOFT TISSUES AND SKULL: No acute soft tissue abnormality. No skull fracture. IMPRESSION: 1. No acute intracranial abnormality. 2. Mild chronic small vessel ischemic disease. Electronically signed by: Dasie Hamburg MD 08/01/2024 10:16 AM EDT RP Workstation: [id number]   CT CHEST ABDOMEN PELVIS WO CONTRAST Result Date: 07/31/2024 CLINICAL DATA:  Generalized weakness seen and nausea with hypoxia, initial encounter EXAM: CT CHEST, ABDOMEN AND PELVIS WITHOUT CONTRAST TECHNIQUE: Multidetector CT imaging of the chest, abdomen and pelvis was performed following the standard protocol without IV contrast. RADIATION DOSE REDUCTION: This exam was performed according to the departmental dose-optimization program which includes automated exposure control, adjustment of the mA and/or kV according to patient size and/or use of iterative reconstruction technique. COMPARISON:  Chest x-ray from earlier in the same day. FINDINGS: CT CHEST FINDINGS Cardiovascular: Somewhat limited due to lack of IV contrast. Atherosclerotic calcifications of the thoracic aorta are noted. Mild focal dilatation of the proximal descending thoracic aorta to 3.6 cm is noted with almost immediate tapering in the  distal descending aorta. Heart is not significantly enlarged in size. Coronary calcifications are noted. Pulmonary artery appears within normal limits. Mediastinum/Nodes: Thoracic inlet is within normal limits. The esophagus as visualized is within normal limits. No hilar or mediastinal adenopathy is noted. Lungs/Pleura: Lungs are well aerated bilaterally. Scarring is seen in the right middle lobe superiorly. Small right lower lobe pulmonary nodule is noted measuring less than 4 mm best seen on image number 111 of series 4. Scarring in the right lung base is noted. Calcified pleural plaques are noted  on the right. Musculoskeletal: Old rib fractures are seen on the right. CT ABDOMEN PELVIS FINDINGS Hepatobiliary: No focal liver abnormality is seen. Status post cholecystectomy. No biliary dilatation. Pancreas: Unremarkable. No pancreatic ductal dilatation or surrounding inflammatory changes. Spleen: Normal in size without focal abnormality. Adrenals/Urinary Tract: Adrenal glands are within normal limits. Kidneys are well visualized bilaterally. No renal calculi or obstructive changes are seen. Right ureter is within normal limits. Left ureter demonstrates distal nonobstructing ureteral stones measuring up to 6 mm. This may represent 2 small adjacent stones. The bladder is within normal limits. Stomach/Bowel: No obstructive or inflammatory changes of the colon are seen. Postsurgical changes in the right colon noted consistent with prior surgical history. The appendix has been surgically removed. No small bowel dilatation is seen. Stomach is unremarkable. Vascular/Lymphatic: Atherosclerotic calcifications are noted. Normal tapering of the abdominal aorta is seen. Bi-iliac stenting is noted. No lymphadenopathy is noted. Reproductive: Uterus and bilateral adnexa are unremarkable. Other: No abdominal wall hernia or abnormality. No abdominopelvic ascites. Musculoskeletal: No acute or significant osseous findings.  IMPRESSION: CT of the chest: Chronic changes in the right lung with scarring and calcified pleural plaques. Right solid pulmonary nodule measuring 3 mm. Per Fleischner Society Guidelines, no routine follow-up imaging is recommended. These guidelines do not apply to immunocompromised patients and patients with cancer. Follow up in patients with significant comorbidities as clinically warranted. For lung cancer screening, adhere to Lung-RADS guidelines. Reference: Radiology. 2017; 284(1):228-43. Focal dilatation of the distal aortic arch with almost immediate tapering. CT of the abdomen and pelvis: Distal left ureteral stones without obstructive change. This measures up to 6 mm and may represent 2 adjacent stones. Postsurgical changes in the right colon. Electronically Signed   By: Oneil Devonshire M.D.   On: 07/31/2024 21:04   DG Chest Port 1 View Result Date: 07/31/2024 CLINICAL DATA:  Shortness of breath. Generalized weakness, nausea, and heart palpitations. Fever. EXAM: PORTABLE CHEST 1 VIEW COMPARISON:  None Available. FINDINGS: Heart size and pulmonary vascularity are normal for technique. Emphysematous changes in the lungs. Patchy alveolar and interstitial infiltrates seen in the lung bases may represent edema or pneumonia. Aspiration less likely. No pleural effusion or pneumothorax. Mediastinal contours appear intact. Calcification of the aorta. Degenerative changes in the spine. IMPRESSION: Patchy infiltrates in the lung bases possibly edema or pneumonia. Electronically Signed   By: Elsie Gravely M.D.   On: 07/31/2024 19:31    Microbiology: Recent Results (from the past 240 hours)  Resp panel by RT-PCR (RSV, Flu A&B, Covid) Anterior Nasal Swab     Status: None   Collection Time: 07/31/24  7:11 PM   Specimen: Anterior Nasal Swab  Result Value Ref Range Status   SARS Coronavirus 2 by RT PCR NEGATIVE NEGATIVE Final    Comment: (NOTE) SARS-CoV-2 target nucleic acids are NOT DETECTED.  The  SARS-CoV-2 RNA is generally detectable in upper respiratory specimens during the acute phase of infection. The lowest concentration of SARS-CoV-2 viral copies this assay can detect is 138 copies/mL. A negative result does not preclude SARS-Cov-2 infection and should not be used as the sole basis for treatment or other patient management decisions. A negative result may occur with  improper specimen collection/handling, submission of specimen other than nasopharyngeal swab, presence of viral mutation(s) within the areas targeted by this assay, and inadequate number of viral copies(<138 copies/mL). A negative result must be combined with clinical observations, patient history, and epidemiological information. The expected result is Negative.  Fact Sheet for  Patients:  BloggerCourse.com  Fact Sheet for Healthcare Providers:  SeriousBroker.it  This test is no t yet approved or cleared by the [place]  FDA and  has been authorized for detection and/or diagnosis of SARS-CoV-2 by FDA under an Emergency Use Authorization (EUA). This EUA will remain  in effect (meaning this test can be used) for the duration of the COVID-19 declaration under Section 564(b)(1) of the Act, 21 U.S.C.section 360bbb-3(b)(1), unless the authorization is terminated  or revoked sooner.       Influenza A by PCR NEGATIVE NEGATIVE Final   Influenza B by PCR NEGATIVE NEGATIVE Final    Comment: (NOTE) The Xpert Xpress SARS-CoV-2/FLU/RSV plus assay is intended as an aid in the diagnosis of influenza from Nasopharyngeal swab specimens and should not be used as a sole basis for treatment. Nasal washings and aspirates are unacceptable for Xpert Xpress SARS-CoV-2/FLU/RSV testing.  Fact Sheet for Patients: BloggerCourse.com  Fact Sheet for Healthcare Providers: SeriousBroker.it  This test is not yet approved or  cleared by the [place]  FDA and has been authorized for detection and/or diagnosis of SARS-CoV-2 by FDA under an Emergency Use Authorization (EUA). This EUA will remain in effect (meaning this test can be used) for the duration of the COVID-19 declaration under Section 564(b)(1) of the Act, 21 U.S.C. section 360bbb-3(b)(1), unless the authorization is terminated or revoked.     Resp Syncytial Virus by PCR NEGATIVE NEGATIVE Final    Comment: (NOTE) Fact Sheet for Patients: BloggerCourse.com  Fact Sheet for Healthcare Providers: SeriousBroker.it  This test is not yet approved or cleared by the [place]  FDA and has been authorized for detection and/or diagnosis of SARS-CoV-2 by FDA under an Emergency Use Authorization (EUA). This EUA will remain in effect (meaning this test can be used) for the duration of the COVID-19 declaration under Section 564(b)(1) of the Act, 21 U.S.C. section 360bbb-3(b)(1), unless the authorization is terminated or revoked.  Performed at [facility], [address] [place], [place] [address]   Blood Culture (routine x 2)     Status: Abnormal (Preliminary result)   Collection Time: 07/31/24  7:37 PM   Specimen: BLOOD  Result Value Ref Range Status   Specimen Description   Final    BLOOD RIGHT ANTECUBITAL Performed at [facility], [address], [place], [place] [address]    Special Requests   Final    Blood Culture results may not be optimal due to an inadequate volume of blood received in culture bottles BOTTLES DRAWN AEROBIC AND ANAEROBIC Performed at [facility], [address] [place], [place] [address]    Culture  Setup Time   Final    GRAM NEGATIVE RODS IN BOTH AEROBIC AND ANAEROBIC BOTTLES CRITICAL VALUE NOTED.  VALUE IS CONSISTENT WITH PREVIOUSLY REPORTED AND CALLED VALUE. GRAM STAIN REVIEWED-AGREE WITH RESULT DRT Performed at [facility],  [address] [place], [place] [address]    Culture (A)  Final    KLEBSIELLA AEROGENES SUSCEPTIBILITIES PERFORMED ON PREVIOUS CULTURE WITHIN THE LAST 5 DAYS. Performed at [facility], [address], [place], [place] [address]    Report Status PENDING  Incomplete  Blood Culture (routine x 2)     Status: Abnormal   Collection Time: 07/31/24  8:19 PM   Specimen: BLOOD LEFT HAND  Result Value Ref Range Status   Specimen Description   Final    BLOOD LEFT HAND Performed at [facility], [address],  [place], [place] [address]    Special Requests   Final    Blood Culture results may not be optimal due to an inadequate volume of blood received in culture bottles BOTTLES DRAWN AEROBIC AND ANAEROBIC Performed at [facility], [address] [place], [place] [address]    Culture  Setup Time   Final    GRAM NEGATIVE RODS IN BOTH AEROBIC AND ANAEROBIC BOTTLES CRITICAL RESULT CALLED TO, READ BACK BY AND VERIFIED WITH: RODNEY GRUBB, PHARMD AT 1153 ON 08/01/24 BY GM GRAM STAIN REVIEWED-AGREE WITH RESULT DRT CRITICAL VALUE NOTED.  VALUE IS CONSISTENT WITH PREVIOUSLY REPORTED AND CALLED VALUE. Performed at [facility], [address] [place], [place] [address]    Culture KLEBSIELLA AEROGENES (A)  Final   Report Status 08/04/2024 FINAL  Final   Organism ID, Bacteria KLEBSIELLA AEROGENES  Final      Susceptibility   Klebsiella aerogenes - MIC*    CEFEPIME <=0.12 SENSITIVE Sensitive     ERTAPENEM <=0.12 SENSITIVE Sensitive     CEFTRIAXONE <=0.25 SENSITIVE Sensitive     CIPROFLOXACIN <=0.06 SENSITIVE Sensitive     GENTAMICIN <=1 SENSITIVE Sensitive     MEROPENEM <=0.25 SENSITIVE Sensitive     TRIMETH/SULFA <=20 SENSITIVE Sensitive     PIP/TAZO Value in next row Sensitive      <=4 SENSITIVEThis is a modified FDA-approved test that has been validated and its performance characteristics determined by the reporting laboratory.  This laboratory is certified  under the Clinical Laboratory Improvement Amendments CLIA as qualified to perform high complexity clinical laboratory testing.    * KLEBSIELLA AEROGENES  Blood Culture ID Panel (Reflexed)     Status: Abnormal   Collection Time: 07/31/24  8:19 PM  Result Value Ref Range Status   Enterococcus faecalis NOT DETECTED NOT DETECTED Final   Enterococcus Faecium NOT DETECTED NOT DETECTED Final   Listeria monocytogenes NOT DETECTED NOT DETECTED Final   Staphylococcus species NOT DETECTED NOT DETECTED Final   Staphylococcus aureus (BCID) NOT DETECTED NOT DETECTED Final   Staphylococcus epidermidis NOT DETECTED NOT DETECTED Final   Staphylococcus lugdunensis NOT DETECTED NOT DETECTED Final   Streptococcus species NOT DETECTED NOT DETECTED Final   Streptococcus agalactiae NOT DETECTED NOT DETECTED Final   Streptococcus pneumoniae NOT DETECTED NOT DETECTED Final   Streptococcus pyogenes NOT DETECTED NOT DETECTED Final   A.calcoaceticus-baumannii NOT DETECTED NOT DETECTED Final   Bacteroides fragilis NOT DETECTED NOT DETECTED Final   Enterobacterales DETECTED (A) NOT DETECTED Final    Comment: Enterobacterales represent a large order of gram negative bacteria, not a single organism. CRITICAL RESULT CALLED TO, READ BACK BY AND VERIFIED WITH: RODNEY GRUBB, PHARMD AT 1153 ON 08/01/24 BY GM    Enterobacter cloacae complex NOT DETECTED NOT DETECTED Final   Escherichia coli NOT DETECTED NOT DETECTED Final   Klebsiella aerogenes DETECTED (A) NOT DETECTED Final    Comment: CRITICAL RESULT CALLED TO, READ BACK BY AND VERIFIED WITH: RODNEY GRUBB, PHARMD AT 1153 ON 08/01/24 BY GM    Klebsiella oxytoca NOT DETECTED NOT DETECTED Final   Klebsiella pneumoniae NOT DETECTED NOT DETECTED Final   Proteus species NOT DETECTED NOT DETECTED Final   Salmonella species NOT DETECTED NOT DETECTED Final   Serratia marcescens NOT DETECTED NOT DETECTED Final   Haemophilus influenzae NOT DETECTED NOT DETECTED Final    Neisseria meningitidis NOT DETECTED NOT DETECTED Final   Pseudomonas aeruginosa NOT DETECTED NOT DETECTED Final   Stenotrophomonas maltophilia NOT DETECTED NOT DETECTED Final  Candida albicans NOT DETECTED NOT DETECTED Final   Candida auris NOT DETECTED NOT DETECTED Final   Candida glabrata NOT DETECTED NOT DETECTED Final   Candida krusei NOT DETECTED NOT DETECTED Final   Candida parapsilosis NOT DETECTED NOT DETECTED Final   Candida tropicalis NOT DETECTED NOT DETECTED Final   Cryptococcus neoformans/gattii NOT DETECTED NOT DETECTED Final   CTX-M ESBL NOT DETECTED NOT DETECTED Final   Carbapenem resistance IMP NOT DETECTED NOT DETECTED Final   Carbapenem resistance KPC NOT DETECTED NOT DETECTED Final   Carbapenem resistance NDM NOT DETECTED NOT DETECTED Final   Carbapenem resist OXA 48 LIKE NOT DETECTED NOT DETECTED Final   Carbapenem resistance VIM NOT DETECTED NOT DETECTED Final    Comment: Performed at [facility], [address] [place], [place] [address]  Urine Culture     Status: Abnormal   Collection Time: 07/31/24  9:38 PM   Specimen: Urine, Random  Result Value Ref Range Status   Specimen Description   Final    URINE, RANDOM Performed at [facility], [address] [place], [place] [address]    Special Requests   Final    NONE Reflexed from [id number] Performed at [facility], [address] [place], [place] [address]    Culture 30,000 COLONIES/mL ENTEROCOCCUS FAECALIS (A)  Final   Report Status 08/04/2024 FINAL  Final   Organism ID, Bacteria ENTEROCOCCUS FAECALIS (A)  Final      Susceptibility   Enterococcus faecalis - MIC*    AMPICILLIN <=2 SENSITIVE Sensitive     NITROFURANTOIN <=16 SENSITIVE Sensitive     VANCOMYCIN 2 SENSITIVE Sensitive     * 30,000 COLONIES/mL ENTEROCOCCUS FAECALIS  Respiratory (~20 pathogens) panel by PCR     Status: None   Collection Time: 08/01/24 10:14 AM   Specimen: Nasopharyngeal Swab; Respiratory  Result  Value Ref Range Status   Adenovirus NOT DETECTED NOT DETECTED Final   Coronavirus 229E NOT DETECTED NOT DETECTED Final    Comment: (NOTE) The Coronavirus on the Respiratory Panel, DOES NOT test for the novel  Coronavirus (2019 nCoV)    Coronavirus HKU1 NOT DETECTED NOT DETECTED Final   Coronavirus NL63 NOT DETECTED NOT DETECTED Final   Coronavirus OC43 NOT DETECTED NOT DETECTED Final   Metapneumovirus NOT DETECTED NOT DETECTED Final   Rhinovirus / Enterovirus NOT DETECTED NOT DETECTED Final   Influenza A NOT DETECTED NOT DETECTED Final   Influenza B NOT DETECTED NOT DETECTED Final   Parainfluenza Virus 1 NOT DETECTED NOT DETECTED Final   Parainfluenza Virus 2 NOT DETECTED NOT DETECTED Final   Parainfluenza Virus 3 NOT DETECTED NOT DETECTED Final   Parainfluenza Virus 4 NOT DETECTED NOT DETECTED Final   Respiratory Syncytial Virus NOT DETECTED NOT DETECTED Final   Bordetella pertussis NOT DETECTED NOT DETECTED Final   Bordetella Parapertussis NOT DETECTED NOT DETECTED Final   Chlamydophila pneumoniae NOT DETECTED NOT DETECTED Final   Mycoplasma pneumoniae NOT DETECTED NOT DETECTED Final    Comment: Performed at [facility], [address], [place], [place] [address]  C Difficile Quick Screen w PCR reflex     Status: None   Collection Time: 08/01/24  5:00 PM   Specimen: STOOL  Result Value Ref Range Status   C Diff antigen NEGATIVE NEGATIVE Final   C Diff toxin NEGATIVE NEGATIVE Final   C Diff interpretation No C. difficile detected.  Final    Comment: Performed at [facility], [address]  [place], [place] [address]  Gastrointestinal Panel by PCR , Stool     Status: None   Collection Time: 08/01/24  5:00 PM   Specimen: Stool  Result Value Ref Range Status   Campylobacter species NOT DETECTED NOT DETECTED Final   Plesimonas shigelloides NOT DETECTED NOT DETECTED Final   Salmonella species NOT DETECTED NOT DETECTED Final   Yersinia enterocolitica NOT DETECTED  NOT DETECTED Final   Vibrio species NOT DETECTED NOT DETECTED Final   Vibrio cholerae NOT DETECTED NOT DETECTED Final   Enteroaggregative E coli (EAEC) NOT DETECTED NOT DETECTED Final   Enteropathogenic E coli (EPEC) NOT DETECTED NOT DETECTED Final   Enterotoxigenic E coli (ETEC) NOT DETECTED NOT DETECTED Final   Shiga like toxin producing E coli (STEC) NOT DETECTED NOT DETECTED Final   Shigella/Enteroinvasive E coli (EIEC) NOT DETECTED NOT DETECTED Final   Cryptosporidium NOT DETECTED NOT DETECTED Final   Cyclospora cayetanensis NOT DETECTED NOT DETECTED Final   Entamoeba histolytica NOT DETECTED NOT DETECTED Final   Giardia lamblia NOT DETECTED NOT DETECTED Final   Adenovirus F40/41 NOT DETECTED NOT DETECTED Final   Astrovirus NOT DETECTED NOT DETECTED Final   Norovirus GI/GII NOT DETECTED NOT DETECTED Final   Rotavirus A NOT DETECTED NOT DETECTED Final   Sapovirus (I, II, IV, and V) NOT DETECTED NOT DETECTED Final    Comment: Performed at [facility], [address] [place], [place] [address]  Culture, blood (Routine X 2) w Reflex to ID Panel     Status: None (Preliminary result)   Collection Time: 08/02/24 12:10 PM   Specimen: BLOOD  Result Value Ref Range Status   Specimen Description BLOOD BLOOD RIGHT HAND  Final   Special Requests   Final    BOTTLES DRAWN AEROBIC AND ANAEROBIC Blood Culture results may not be optimal due to an inadequate volume of blood received in culture bottles   Culture   Final    NO GROWTH 2 DAYS Performed at [facility], [address] [place], [place] [address]    Report Status PENDING  Incomplete  Culture, blood (Routine X 2) w Reflex to ID Panel     Status: None (Preliminary result)   Collection Time: 08/02/24  8:32 PM   Specimen: BLOOD  Result Value Ref Range Status   Specimen Description BLOOD BLOOD RIGHT HAND  Final   Special Requests   Final    BOTTLES DRAWN AEROBIC ONLY Blood Culture results may not be optimal due to an  inadequate volume of blood received in culture bottles   Culture   Final    NO GROWTH 2 DAYS Performed at [facility], [address] [place], [place] [address]    Report Status PENDING  Incomplete     Labs: Basic Metabolic Panel: Recent Labs  Lab 07/31/24 1912 08/01/24 0531 08/02/24 0347 08/03/24 0326 08/04/24 0445  NA 134* 137 136 136 136  K 3.2* 3.7 3.6 3.8 4.0  CL 96* 100 101 105 107  CO2 24 26 23 25 22   GLUCOSE 221* 181* 199* 169* 174*  BUN 16 14 13 11 16   CREATININE 1.30* 1.28* 1.04* 0.91 0.83  CALCIUM  9.1 8.9 8.8* 8.6* 8.7*  MG  --   --  1.9  --   --   PHOS  --   --  2.6  --   --    Liver Function Tests: Recent Labs  Lab 07/31/24 1937  AST 24  ALT 13  ALKPHOS 40  BILITOT 0.6  PROT 7.3  ALBUMIN 3.6   No results for input(s): LIPASE, AMYLASE in the last 168 hours. No results for input(s): AMMONIA in the last 168 hours. CBC: Recent Labs  Lab 07/31/24 1912 08/01/24 0531 08/02/24 0347 08/03/24 0326 08/04/24 0445  WBC 22.6* 20.9* 17.6* 15.3* 14.1*  HGB 11.4* 10.3* 9.5* 9.3* 9.2*  HCT 34.4* 31.3* 30.1* 28.7* 28.7*  MCV 86.4 86.7 87.8 87.0 86.4  PLT 401* 357 306 294 364   Cardiac Enzymes: No results for input(s): CKTOTAL, CKMB, CKMBINDEX, TROPONINI in the last 168 hours. BNP: BNP (last 3 results) Recent Labs    07/31/24 1912  BNP 243.9*    ProBNP (last 3 results) No results for input(s): PROBNP in the last 8760 hours.  CBG: Recent Labs  Lab 08/03/24 0748 08/03/24 1229 08/03/24 1650 08/03/24 2005 08/04/24 1218  GLUCAP 177* 227* 234* 239* 182*       Signed:  Devaughn KATHEE Ban MD.  [facility] 08/04/2024, 1:29 PM

## 2024-08-04 NOTE — TOC CM/SW Note (Signed)
 Patient is not able to walk the distance required to go the bathroom, or he/she is unable to safely negotiate stairs required to access the bathroom.  A 3in1 BSC will alleviate this problem

## 2024-08-07 LAB — CULTURE, BLOOD (ROUTINE X 2)
Culture: NO GROWTH
Culture: NO GROWTH
